# Patient Record
Sex: Female | Born: 1935 | Race: White | Hispanic: No | Marital: Married | Smoking: Former smoker
Health system: Southern US, Community
[De-identification: ages and names within clinical notes are randomized; demographics above are authoritative.]

## PROBLEM LIST (undated history)

## (undated) DIAGNOSIS — F329 Major depressive disorder, single episode, unspecified: Secondary | ICD-10-CM

## (undated) DIAGNOSIS — K219 Gastro-esophageal reflux disease without esophagitis: Secondary | ICD-10-CM

## (undated) DIAGNOSIS — D649 Anemia, unspecified: Secondary | ICD-10-CM

## (undated) DIAGNOSIS — I2510 Atherosclerotic heart disease of native coronary artery without angina pectoris: Secondary | ICD-10-CM

## (undated) DIAGNOSIS — C801 Malignant (primary) neoplasm, unspecified: Secondary | ICD-10-CM

## (undated) DIAGNOSIS — R51 Headache: Secondary | ICD-10-CM

## (undated) DIAGNOSIS — F32A Depression, unspecified: Secondary | ICD-10-CM

## (undated) DIAGNOSIS — I209 Angina pectoris, unspecified: Secondary | ICD-10-CM

## (undated) DIAGNOSIS — M1990 Unspecified osteoarthritis, unspecified site: Secondary | ICD-10-CM

## (undated) HISTORY — PX: CARDIAC CATHETERIZATION: SHX172

## (undated) HISTORY — PX: EYE SURGERY: SHX253

## (undated) HISTORY — PX: COLON SURGERY: SHX602

## (undated) HISTORY — PX: APPENDECTOMY: SHX54

## (undated) HISTORY — PX: BACK SURGERY: SHX140

## (undated) HISTORY — PX: ABDOMINAL HYSTERECTOMY: SHX81

## (undated) HISTORY — PX: FRACTURE SURGERY: SHX138

---

## 1999-07-02 ENCOUNTER — Encounter: Admission: RE | Admit: 1999-07-02 | Discharge: 1999-07-02 | Payer: Self-pay | Admitting: Neurosurgery

## 1999-07-02 ENCOUNTER — Encounter: Payer: Self-pay | Admitting: Neurosurgery

## 1999-07-07 ENCOUNTER — Ambulatory Visit (HOSPITAL_COMMUNITY): Admission: RE | Admit: 1999-07-07 | Discharge: 1999-07-07 | Payer: Self-pay | Admitting: Neurosurgery

## 1999-07-07 ENCOUNTER — Encounter: Payer: Self-pay | Admitting: Neurosurgery

## 2000-06-19 ENCOUNTER — Ambulatory Visit (HOSPITAL_COMMUNITY): Admission: RE | Admit: 2000-06-19 | Discharge: 2000-06-19 | Payer: Self-pay | Admitting: Cardiology

## 2001-11-03 ENCOUNTER — Encounter: Admission: RE | Admit: 2001-11-03 | Discharge: 2001-11-03 | Payer: Self-pay | Admitting: Neurosurgery

## 2001-11-03 ENCOUNTER — Encounter: Payer: Self-pay | Admitting: Neurosurgery

## 2001-11-16 ENCOUNTER — Encounter: Payer: Self-pay | Admitting: Neurosurgery

## 2001-11-16 ENCOUNTER — Encounter: Admission: RE | Admit: 2001-11-16 | Discharge: 2001-11-16 | Payer: Self-pay | Admitting: Neurosurgery

## 2002-02-28 ENCOUNTER — Encounter: Admission: RE | Admit: 2002-02-28 | Discharge: 2002-02-28 | Payer: Self-pay | Admitting: Neurosurgery

## 2002-02-28 ENCOUNTER — Encounter: Payer: Self-pay | Admitting: Neurosurgery

## 2003-02-10 ENCOUNTER — Inpatient Hospital Stay (HOSPITAL_COMMUNITY): Admission: RE | Admit: 2003-02-10 | Discharge: 2003-02-17 | Payer: Self-pay | Admitting: Neurosurgery

## 2004-03-28 ENCOUNTER — Ambulatory Visit (HOSPITAL_COMMUNITY): Admission: RE | Admit: 2004-03-28 | Discharge: 2004-03-28 | Payer: Self-pay | Admitting: Internal Medicine

## 2004-03-28 ENCOUNTER — Ambulatory Visit: Payer: Self-pay | Admitting: Internal Medicine

## 2004-09-30 ENCOUNTER — Ambulatory Visit: Payer: Self-pay | Admitting: Internal Medicine

## 2004-09-30 ENCOUNTER — Ambulatory Visit (HOSPITAL_COMMUNITY): Admission: RE | Admit: 2004-09-30 | Discharge: 2004-09-30 | Payer: Self-pay | Admitting: Internal Medicine

## 2004-10-25 ENCOUNTER — Encounter: Admission: RE | Admit: 2004-10-25 | Discharge: 2004-10-25 | Payer: Self-pay | Admitting: Vascular Surgery

## 2006-02-12 ENCOUNTER — Ambulatory Visit: Payer: Self-pay | Admitting: Cardiology

## 2006-02-16 ENCOUNTER — Inpatient Hospital Stay (HOSPITAL_BASED_OUTPATIENT_CLINIC_OR_DEPARTMENT_OTHER): Admission: RE | Admit: 2006-02-16 | Discharge: 2006-02-16 | Payer: Self-pay | Admitting: Cardiology

## 2006-02-16 ENCOUNTER — Ambulatory Visit: Payer: Self-pay | Admitting: Cardiology

## 2006-02-18 ENCOUNTER — Ambulatory Visit: Payer: Self-pay | Admitting: Cardiology

## 2006-02-19 ENCOUNTER — Ambulatory Visit (HOSPITAL_COMMUNITY): Admission: RE | Admit: 2006-02-19 | Discharge: 2006-02-20 | Payer: Self-pay | Admitting: Cardiovascular Disease

## 2006-02-19 ENCOUNTER — Ambulatory Visit: Payer: Self-pay | Admitting: Cardiovascular Disease

## 2006-02-27 ENCOUNTER — Ambulatory Visit: Payer: Self-pay | Admitting: Cardiology

## 2006-03-24 ENCOUNTER — Ambulatory Visit: Payer: Self-pay | Admitting: Cardiology

## 2006-03-26 ENCOUNTER — Ambulatory Visit: Payer: Self-pay | Admitting: Vascular Surgery

## 2006-04-17 ENCOUNTER — Ambulatory Visit: Payer: Self-pay | Admitting: Cardiology

## 2006-04-21 ENCOUNTER — Ambulatory Visit: Payer: Self-pay | Admitting: Cardiology

## 2006-04-28 ENCOUNTER — Ambulatory Visit: Payer: Self-pay | Admitting: Cardiology

## 2006-06-24 ENCOUNTER — Ambulatory Visit: Payer: Self-pay | Admitting: Internal Medicine

## 2006-06-29 ENCOUNTER — Ambulatory Visit (HOSPITAL_COMMUNITY): Admission: RE | Admit: 2006-06-29 | Discharge: 2006-06-29 | Payer: Self-pay | Admitting: Internal Medicine

## 2006-07-01 ENCOUNTER — Ambulatory Visit: Payer: Self-pay | Admitting: Internal Medicine

## 2009-12-04 ENCOUNTER — Ambulatory Visit: Payer: Self-pay | Admitting: Internal Medicine

## 2010-06-04 NOTE — Op Note (Signed)
NAME:  Alexandria Wilson, Alexandria Wilson               ACCOUNT NO.:  [id number]   MEDICAL RECORD NO.:  [id number]          PATIENT TYPE:  AMB   LOCATION:  DAY                           FACILITY:  [facility]   PHYSICIAN:  Lionel December, M.D.    DATE OF BIRTH:  1928/03/22   DATE OF PROCEDURE:  06/29/2006  DATE OF DISCHARGE:  06/29/2006                               OPERATIVE REPORT   PROCEDURE:  Esophageal manometry.   INDICATIONS:  Ms. Roepke is a 77 year old Caucasian female with  recurrent or daily chest pain who has undergone cardiac evaluation with  coronary stenting.  Her cardiologist feels that this is noncardiac pain.  She is been on PPI without any benefit.  She is undergoing manometry to  rule out diffuse esophageal spasm and nutcracker esophagus etc that  might explain her pain.  Procedure risks were reviewed the patient,  informed consent was obtained.   Procedure performed using Medtronic solid state system.   Esophageal body.  10 out of 10 swallows were peristaltic.  Mean  amplitude at 18 cm above the LES was 56.2 mmHg and duration is 2.9  seconds.   Mean amplitude at 13 cm proximal or above LES was 53.7 and duration was  3.5 seconds.   Mean amplitude at 8 cm above LES was 91.9 and duration was 5.3 seconds.   Mean amplitude at 3 cm above LES was 117.6 and duration was 5.3.   Mean amplitude in distal two ports was 104.8 mmHg.   Lower esophageal sphincter is located between 39 and 43 centimeters from  the nares.  Mean resting LES pressure was 12.5 mmHg.  Relaxation was  100%.   Upper esophageal sphincter was not studied.   IMPRESSION:  Low resting LES pressure, otherwise normal esophageal  manometry.   No evidence of diffuse esophageal spasm or nutcracker esophagus.   RECOMMENDATIONS:  If the patient does not respond to anxiolytic therapy,  would offer esophagogastroduodenoscopy with pH study with Bravo.      Lionel December, M.D.  Electronically Signed     NR/MEDQ  D:   07/01/2006  T:  07/01/2006  Job:  [id number]   cc:   Weyman Pedro  [address]  [place] [address]

## 2010-06-04 NOTE — Consult Note (Signed)
NAME:  Alexandria Wilson, Alexandria Wilson               ACCOUNT NO.:  [id number]   MEDICAL RECORD NO.:  [id number]          PATIENT TYPE:  AMB   LOCATION:  DAY                           FACILITY:  [facility]   PHYSICIAN:  Lionel December, M.D.    DATE OF BIRTH:  04/23/1928   DATE OF CONSULTATION:  06/24/2006  DATE OF DISCHARGE:                                 CONSULTATION   PRESENTING COMPLAINT:  Recurrent chest pain felt to be noncardiac.   HISTORY OF PRESENT ILLNESS:  Alexandria Wilson is a 77 year old Caucasian female who  is referred through courtesy of Dr. Eliberto Ivory for GI evaluation.  The  patient presents with a history of retrosternal and anterior chest pain  which started sometime in February.  She had a cardiac cath and she had  one artery stented.  However, she has never been pain free.  Either in  March or April of this year she had an exercise tolerance test as well  as nuclear medicine test both of which were negative.  She was seen by a  cardiologist and repeat cardiac cath was advised.  However, she has  decided not to have it.  Her cardiologist felt that her chest pain is  not cardiac in origin.  The patient is experiencing, chest pain every  other day if not daily.  On some days she may have more than one  episode.  The pain typically is relieved with nitroglycerin occasionally  she has taken up to three doses a day.  The pain has radiated into her  neck and jaw.  At times she has felt short of breath, but no history of  lightheadedness or palpitations.  She has been on Prilosec although she  has not experienced heartburn lately.  She cannot tell any difference  since she has been on Prilosec.  She denies dysphagia, hoarseness,  chronic cough but needs to clear her throat.  She has a good appetite  and has not lost any weight.  She has not been able to pinpoint any  triggers as far as this pain is concerned.  She has not experienced this  pain with exertional activity.   REVIEW OF SYSTEMS:  Negative for  melena, rectal bleeding or abdominal  pain.   MEDICATIONS:  1. She is on Imdur 30 mg three times a day.  2. Ogen 1.2 mg daily, Prilosec 20 mg q.a.m.  3. MVI every day daily.  4. ASA 325 mg daily.  5. Nitroglycerin sublingual p.r.n.  6. Restasis eye drops to both eyes.   PAST HISTORY:  She has dry eyes.  She had a sigmoid colon resection in  1986 for adenocarcinoma of the colon.  She had colonoscopy in January  1999 with removal of a small polyp which had edematous changes.  She had  another colonoscopy in March 2006 with removal of multiple polyps; three  of which were adenomas.  She returned for follow-up exam in September  2006 and no polyps were noted.  She had melanosis coli and external  hemorrhoids.   She has mitral valve prolapse.  She had lumbar spine surgery  for disk  disease 15 years ago and she had fusion of this area in January 2006.  She also had a laparotomy 18 months after childbirth  apparently to  correct some abnormalities related to extensive perineal tear. Other  surgeries include appendectomy.  She had hysterectomy at age 17 with  removal of ovaries.  One ovary had been removed at an earlier date.  She  has had bilateral cataract surgery.  She has a history of  hyperlipidemia.   ALLERGIES:  NK.   FAMILY HISTORY:  Mother had Alzheimer's disease and CHF and died at age  20.  Father died of stroke at age 34.  She has two brothers and one has  tachy arrhythmia and CHF.   SOCIAL HISTORY:  She is married.  She has one daughter who has multiple  medical problems. She is retired from [employer] where she worked for 33  years. She has been smoking for years.  She smoked no more 2-3 per day  and now down to one cigarettes per day and is trying her best to quit.  She does not drink alcohol.   OBJECTIVE:  GENERAL:  Pleasant well-developed thin Caucasian female who  is in no acute distress.  VITAL SIGNS:  She weighs 115.5 pounds.  She is 5 feet tall.  Pulse 60  per  minute, blood pressure 102/60, temperature is 98.4.  HEENT:  Conjunctivae is pink.  Sclerae is nonicteric. Oral pharyngeal  mucosa is normal.  She has upper dentures.  She has few of her teeth in  the lower jaw.  She states she lost a partial.  NECK:  No neck masses or thyromegaly noted.  HEART:  Cardiac exam with regular rhythm.  Normal S1-S2.  No click or  murmur noted.  LUNGS:  Clear to auscultation.  No chest wall tenderness noted on  palpation.  Her abdomen is flat and soft without tenderness,  organomegaly or masses.  RECTAL:  Examination is deferred.  EXTREMITIES:  She does not have peripheral edema or clubbing.   ASSESSMENT:  1. Recurrent chest pain.  She is status post coronary stenting in      February this year and she had noninvasive follow up testing which      has been negative.  She does not have any symptoms whatsoever to      suggest gastroesophageal reflux disease or for that matter      esophageal spasm.  The patient is very much interested in      esophageal manometry test that had been recommended to her by Dr.      Eliberto Ivory.  I feel it would be reasonable, although she has not had      her upper GI tract evaluated radiographically or endoscopically.      It is certainly possible that her chest pain is due to GE reflux      and we can optimize the therapy and see what happens.   RECOMMENDATIONS:  1. The patient is advised to discontinue Prilosec.  2. We will start her on Nexium 40 mg p.o. q.a.m.  3. We will schedule for esophageal manometry at [facility] in the near      future.  If manometry is negative may consider EGD with pH study.      I have also recommended lorazepam at 2.5 mg t.i.d. just in case she      is having chest pain due to esophageal spasm.  Prescription given  for 90 with one refill.  Please note that she has taken      lorazepam in a much higher dose intermittently in the past without     any significant problems.  I have reviewed the procedure  with the      patient and she is agreeable.   We appreciate the opportunity to participate in the care of this nice  lady.      Lionel December, M.D.  Electronically Signed     NR/MEDQ  D:  06/24/2006  T:  06/25/2006  Job:  [id number]   cc:   Weyman Pedro  [address]  [place] [address]   [facility]

## 2010-06-07 NOTE — Assessment & Plan Note (Signed)
[facility]                          [facility] NOTE   MAHITHA, HICKLING                      MRN:          [id number]  DATE:03/24/2006                            DOB:          April 03, 1928    PRIMARY CARDIOLOGIST:  Learta Codding, MD,FACC   REASON FOR VISIT:  Post-hospital followup.   Miss Ricciardi presents in clinic followup after undergoing recent  percutaneous intervention with bare metal stenting of a high grade  proximal LAD lesion, found during initial outpatient diagnostic  catheterization for further evaluation of recurrent chest pain. Residual  anatomy revealed nonobstructive coronary artery disease and left  ventricular function was normal with no wall motion abnormalities.   Patient then was briefly hospitalized here at [facility] on February 7th,  1 week after undergoing percutaneous intervention, with complaint of  chest pain. She ruled out for myocardial infarction and serial EKGs  showed no acute changes. We saw the patient in consultation and added  Imdur 30 q.d. to her medication regimen. We also noted a bruit in the  right groin and ordered a Duplex scan which was negative for  pseudoaneurysm. We also detected a bruit of the left  carotid/supraclavicular region and suggested further evaluation as an  outpatient. The carotid Doppler was done and did suggest a 60%-79% left  ICA stenosis, which was new compared to previous study of June 2003.   Clinically, patient has not had any further angina pectoris, which  precipitated her recent brief stay here at [facility], since being placed  on Imdur. However, she complains of nausea on this medication with no  associated vomiting and has diminished energy as well.   Patient has completed her 1 month course of Plavix and has been  compliant with full dose aspirin as well.   Electrocardiogram today reveals normal sinus rhythm at 65 BPM with  normal axis and no ischemic  changes.   CURRENT MEDICATIONS:  1. Aspirin 325 q.d.  2. Isosorbide 30 q.d.  3. Ogen 0.625 b.i.d.  4. Restasis eye drops as directed.   PHYSICAL EXAMINATION:  Blood pressure 106/57, pulse 67 regular, weight  115.6.  GENERAL: A 77 year old female sitting upright, no distress.  HEENT: Normocephalic, atraumatic.  NECK: Palpable bilateral carotid pulses with left supraclavicular  bruits.  LUNGS: Clear to auscultation all fields.  HEART: Regular rate and rhythm (S1, S2) no significant murmurs.  ABDOMEN: Soft, nontender, intact bowel sounds.  EXTREMITIES: Right groin stable with no ecchymosis, hematoma, or bruits  on auscultation; intact right femoral and posterior tibialis pulse  without edema.   IMPRESSION:  1. Severe single vessel coronary artery disease.      a.     Status post recent bare metal stenting, high grade proximal       left anterior descending stenosis.      b.     Residual nonobstructive coronary artery disease.      c.     Normal left ventricular function.  2. Cerebral vascular disease.      a.     60%-79% left ICA stenosis.  3. Chronic hypotension.  4. Hyperlipidemia.      a.     INTOLERANT TO MULTIPLE STATINS.  5. History of tobacco.   PLAN:  1. Down titrate Imdur to 15 mg q.d., to see if patient can better      tolerate this.  2. Refer patient to Dr. Gretta Began in [place] for further      evaluation of cerebral vascular disease, with whom she followed in      the past.  3. Schedule return clinic follow up with myself and Dr. Andee Lineman in 6      months.      Gene Serpe, PA-C  Electronically Signed      Learta Codding, MD,FACC  Electronically Signed   GS/MedQ  DD: 03/24/2006  DT: 03/24/2006  Job #: [id number]   cc:   Weyman Pedro

## 2010-06-07 NOTE — Assessment & Plan Note (Signed)
[facility]                          [facility]   Alexandria Wilson, Alexandria Wilson                      MRN:          [id number]  DATE:04/17/2006                            DOB:          July 08, 1928    REASON FOR OFFICE VISIT:  The patient came by the office urgently due to  concerns of new onset chest pain.   HISTORY OF PRESENT ILLNESS:  The patient is an elderly female with a  history of single vessel coronary artery disease.  She is status post  bare metal stenting of a high grade proximal LAD lesion.  She also has  residual nonobstructive coronary artery disease.  She has normal LV  function.  The patient was briefly hospitalized at [facility] on February 26, 2006, one week after undergoing percutaneous coronary intervention  with a complaint of chest pain.  She ruled out for myocardial  infarction.  Serial EKGs showed no acute changes.  The patient was seen  in consultation and Imdur 30 mg a day was added to her medical regimen.  The patient also was found to have a bruit in the right carotid.  A  duplex scan was negative for pseudoaneurysm.  In addition, a bruit of  the left carotid supraclavicular region was detected and further  evaluation was performed as an outpatient.  Doppler study was done that  suggested 79% left ICA stenosis which was new compared to previous study  of June 2003. The patient was seen on March 24, 2006, in the office by  Gene Serpe, PA-C. The patient actually was doing quite well.  She had no  further angina pectoris.  She had no orthopnea or PND.  She had no  palpitations or syncope.  The patient at that time completed her one-  month course of Plavix for bare metal stenting.   The patient has brought herself emergently to our office and wanted to  be seen as an add-on.  She stayed up last night while at [facility],  she  experienced a sudden onset of substernal chest pain which she felt was  reminiscent to the pain she  experienced when Dr. Excell Seltzer blew up the  balloon in her heart.  She stated it was a heavy left sided pressure  radiating to the neck area.  She took two nitroglycerin with the second  one eventually relieving her symptoms.  The patient over the course of  the evening had no further symptoms.  However later today, the patient  again started to experience similar symptoms that occurred at rest.  There was no shortness of breath or diaphoresis.  Her symptoms are  actually quite reminiscent of her presentation of February 26, 2006 to  [facility].  A STAT EKG was done in the office which showed no  acute ischemic change.  The patient also was given nitroglycerin in the  office with improvement in her symptoms.   MEDICATIONS:  1. Multivitamin daily.  2. Citracal.  3. Ogen 0.625 mg p.o. b.i.d.  4. BC Powder p.r.n.  5. Aspirin 325 daily.  6. Imdur 50 mg p.o.  daily.   ALLERGIES:  1. DOXYCYCLINE.  2. INTOLERANCE TO MULTIPLE STATINS.   SOCIAL HISTORY:  The patient lives with her husband in [place].   FAMILY HISTORY:  Noncontributory.   REVIEW OF SYSTEMS:  No nausea or vomiting, no fever or chills, no  orthopnea, PND, no palpitations or syncope, no melena or hematochezia,  no dysuria or frequency, no neurological symptoms, no visual changes, no  auditory changes.   PHYSICAL EXAMINATION:  VITAL SIGNS:  Blood pressure is 121/67, heart  rate is 68 beats per minute.  GENERAL:  A well nourished white female in no apparent distress.  HEENT:  Pupils are equal, round, reactive.  Conjunctivae clear.  NECK:  Supple.  Normal carotid upstroke.  A soft bilateral carotid  bruits with a left supraclavicular bruit.  LUNGS:  Clear breath sounds bilaterally.  HEART:  Regular rate and rhythm.  Normal S1 S2.  No murmurs, rubs, or  gallops.  ABDOMEN:  Soft, nontender.  No rebound, tenderness, or guarding.  Good  bowel sounds.  EXTREMITIES:  No cyanosis, clubbing, or edema.  NEUROLOGIC:  The patient is  alert, oriented, grossly nonfocal.   PROBLEM LIST:  1. Substernal chest pain, rule out unstable angina.  2. Severe single vessel coronary artery disease.      a.     Status post recent bare metal stenting of a high grade       proximal left anterior descending artery lesion.      b.     Residual nonobstructive disease.      c.     Normal left ventricular function.  3. Cerebrovascular disease.      a.     A 79% left internal carotid artery stenosis.  4. Chronic hypotension, stable.  5. Dyslipidemia.      a.     Intolerant of multiple Statins.  6. History of tobacco use.   PLAN:  1. The patient will be admitted to [facility] for a rule out      myocardial infarction protocol.  2. The patient's chest pain, although reminiscent of her prior pain is      still somewhat atypical and there are no acute EKG changes.  I      suspect the patient is low risk for restenosis.  3. The patient will be given Lopressor and p.r.n. nitroglycerin as      well as Imdur increased to 30 mg a day.  4. The patient will be referred to the emergency room for admission.  5. If the cardiac enzymes are negative, I do anticipate the patient      can be discharged and      scheduled for a stress test next week.  We will follow her up in      the office also in one week after hospital discharge.     Learta Codding, MD,FACC  Electronically Signed    GED/MedQ  DD: 04/17/2006  DT: 04/17/2006  Job #: [id number]

## 2010-06-07 NOTE — Assessment & Plan Note (Signed)
[facility]                          [facility]   TYAH, ACORD                      MRN:          [id number]  DATE:02/18/2006                            DOB:          1928-01-30    HISTORY OF PRESENT ILLNESS:  The patient is a 77 year old female with  history of substernal chest pain.  The patient was recently referred for  cardiac catheterization and was found to have 70% to 80% proximal LAD  lesion.  The patient presented today in the office to discuss further  planned intervention for her coronary artery disease.   She tells me that she still has ongoing chest pain, particularly on  exertion.  As noted in my previous note, the patient did take  nitroglycerin with improvement in her symptoms.  Her symptoms are  consistent with angina.  Review of the catheterization with Dr. Excell Seltzer  and Dr. Samule Ohm gave somewhat divergent opinions.  Dr. Excell Seltzer felt that  the patient's lesion definitely was significant and could benefit from  stenting.  Dr. Samule Ohm felt that the patient should receive a stress test  first.  Therefore, I had the patient come back today to further discuss  this.  However, of note is that this patient had a stress test done two  years ago which was already abnormal with apical and inferior lesions,  prompting a cardiac catheterization at that time.  Therefore, I do not  think we can use the stress test as a good guide to whether this patient  has ischemia or not, and I have already discussed the plan with Dr.  Excell Seltzer.  It is our feeling that we should proceed at this point in time,  possibly with IVUS and most likely stenting of the LAD lesion.  Discussed at length with the patient and her husband and they are very  much in favor to proceed in this pattern.   The patient has multiple intolerances to medications including MULTIPLE  STATINS.  Her blood pressure is also low normal and she states she does  not do  well with medications.  Of note is that this patient actually did  take PLAVIX  back in 2006 which was started by Dr. Eliberto Ivory.  She took it  for six months.  She eventually stopped it due to stomach upset.  Will  make sure that this information get relayed to Dr. Excell Seltzer.   Nevertheless, it seems that this patient could certainly benefit from  stenting as I feel convinced that her symptoms are consistent with  angina, particularly in light that she probably will not be able to  tolerate much in the way of blood pressure medications.   I placed a call to Dr. Eliberto Ivory to update him on the situation.  I also  will put in a call to Dr. Excell Seltzer to let him know that the patient will  be scheduled for an IVUS and possible stent placement in the morning.     Learta Codding, MD,FACC  Electronically Signed    GED/MedQ  DD: 02/18/2006  DT: 02/18/2006  Job #: [id number]  cc:   Weyman Pedro

## 2010-06-07 NOTE — Op Note (Signed)
NAME:  Alexandria Wilson, Alexandria Wilson               ACCOUNT NO.:  [id number]   MEDICAL RECORD NO.:  [id number]          PATIENT TYPE:  AMB   LOCATION:  DAY                           FACILITY:  [facility]   PHYSICIAN:  Lionel December, M.D.    DATE OF BIRTH:  26-Dec-1928   DATE OF PROCEDURE:  03/28/2004  DATE OF DISCHARGE:                                 OPERATIVE REPORT   PROCEDURE:  Total colonoscopy with polypectomy.   INDICATIONS:  [name] is a 77 year old Caucasian female who is status  post sigmoid colon resection for carcinoma over 20 years ago, whose last  surveillance exam was in May 2002, when she had three small polyps ablated  via cold biopsy which were adenomas.  She is now returning for surveillance  exam.  She is almost yearly.  She has had some other medical issues  resulting in postponing of her colonoscopy.  She remains free of symptoms.  She has chronic constipation for which she takes medicines.   The procedure risks were reviewed the patient, informed consent was  obtained.   PREMEDICATION:  Versed 3 mg IV.   FINDINGS:  Procedure performed in endoscopy suite.  The patient's vital  signs and O2 saturation were monitored during procedure and remained stable.  The patient was placed in the left lateral recumbent position and rectal  examination performed.  No abnormality noted external or digital exam.  Olympus video scope was placed in the rectum and advanced under vision into  sigmoid colon and beyond.  She had diffuse  changes of melanosis coli.  She had multiple polyps.  All of the larger ones  were removed but not the smaller ones.  The scope was passed to the cecum,  which was identified by appendiceal orifice and ileocecal valve.  Pictures  taken for the record.  There were five polyps at cecum.  Two of these were  raised with submucosal injection of saline and snared, one was partially  coagulated.  The three polyps smaller simply coagulated using snare tip.  There was a 15 to  16-mm sessile polyp at transverse colon, which was snared  after raising it with a submucosal injection of saline.  There was another  polyp close to it, which was small and coagulated using snare tip.  Two  polyps were snared from descending colon, one after raising it with a  submucosal injection of normal saline.  These were 6 to 8 mm.  One was large  and 8-10 mm.  There were two more polyps that were snared from the sigmoid  colon.  One of these polyps was about 6 x 12 mm and was snared after  elevating with submucosal injection of normal saline.  Rectal mucosa was normal.  Scope was retroflexed to examine anorectal  junction, which was unremarkable.  Endoscope was straightened and withdrawn.  The patient tolerated the procedure well.   FINAL DIAGNOSES:  1.  Melanosis coli.  2.  Multiple colonic polyps.  Seven were snared, two from the cecum, one      from transverse colon, two from descending and two from  sigmoid.  At      least four small polyps were coagulated.  She still had few polyps      remaining.   RECOMMENDATIONS:  1.  Standard instructions given.  2.  We will plan to bring her back in six months for repeat colonoscopy and      hopefully take care of the rest of the polyps.      NR/MEDQ  D:  03/28/2004  T:  03/28/2004  Job:  [id number]   cc:   Dr. Eliberto Ivory

## 2010-06-07 NOTE — Op Note (Signed)
NAME:  LATORIE, MONTESANO                         ACCOUNT NO.:  [id number]   MEDICAL RECORD NO.:  [id number]                   PATIENT TYPE:  INP   LOCATION:  3172                                 FACILITY:  [facility]   PHYSICIAN:  Coletta Memos, M.D.                  DATE OF BIRTH:  June 22, 1928   DATE OF PROCEDURE:  02/10/2003  DATE OF DISCHARGE:                                 OPERATIVE REPORT   PREOPERATIVE DIAGNOSIS:  1. Degenerative disc disease L4-L5 and L5-S1.  2. Recurrent disc herniation L4-L5.  3. 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  4. Lumbar radiculopathy.   POSTOPERATIVE DIAGNOSIS:  1. Degenerative disc disease L4-L5 and L5-S1.  2. Recurrent disc herniation L4-L5.  3. 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  4. Lumbar radiculopathy.   PROCEDURE:  L4 laminectomy, posterolateral arthrodesis L4 to L5, pedicle  screw fixation L4-L5, redo discectomy L4-L5.   COMPLICATIONS:  Dural tear repaired primarily.   SURGEON:  Coletta Memos, M.D.   ASSISTANT:  Hewitt Shorts, M.D.   INDICATIONS FOR PROCEDURE:  Alexandria Wilson is a 77 year old woman who  presented with severe degenerated disc at L4-L5 and L5-S1 and pain in the  right lower extremity due to what appeared to be a recurrent disc at L4-L5  on the right side.  I recommended and she agreed to undergo a two level  fusion L4 to S1 and redo discectomy.   OPERATIVE NOTE:  The patient was brought to the operating room, intubated,  and placed under general anesthesia without difficulty.  She was rolled  prone on body rolls and all pressure points were properly padded.  Her back  was prepped and she was draped in a sterile fashion.  A Foley catheter had  been placed sterilely prior to positioning.  I opened the skin with a #10  blade using the old incision as a guide exposing the lamina of L4, L5, and  S1.  I then, while using the Kerrison punch to undermine the lamina of L4,  noticed I had a large  arachnoid bubble.  No CSF had come out at that point  in time.  I needed to remove further scar tissue and in removing the scar  tissue, the dura did open.  I was able to close that hole primarily.  Then,  using the Kerrison punch again, now caudally, again with scar tissue and  after dissecting some, I noticed a hole open in the dura and that, too, was  repaired primarily.  I eventually was able to retract the thecal sac  medially.  I identified the L4 and L5 pedicles.  I identified those nerve  roots.  I was able to decompress the nerve roots.  I did remove what  appeared to be partially calcified material where the disc space was.  I was  not able to get into the disc space as it  was markedly degenerated and  essentially collapsed.  At L5-S1, that level was so collapsed I did not  think that it would be worth going again through the scar, but I did place  pedicle screws there.  The thecal sac was not compressed at that level.  Pedicle screws were placed with fluoroscopic guidance at L4, L5, and S1  bilaterally.  I had to remove the screw at L5 on the right side as the screw  heads were too close together.  I simply placed the rod spanning L4 to S1  and let L5 float on the right side.  There were three screws placed on the  left side.  This was the Legacy System, 5.25 by 35 mm screws were placed at  each level.  These screws were first placed by using a pedicle probe,  tapping, inspecting that hole for any bone breeches, and then placing the  screw.  AP and lateral films showed the screws to be in good position.  I  then placed the rod and secured those.  Then, I used Tisseel and Duragen to  cover the dural defects.  The was then closed in layered fashion using  Vicryl sutures.  A sterile dressing was applied.  The patient tolerated the  procedure well.                                               Coletta Memos, M.D.    KC/MEDQ  D:  02/10/2003  T:  02/11/2003  Job:  [id number]

## 2010-06-07 NOTE — Assessment & Plan Note (Signed)
[facility]                          [facility] OFFICE NOTE   ABBAGALE, GOGUEN                      MRN:          [id number]  DATE:04/28/2006                            DOB:          January 19, 1929    PRIMARY CARDIOLOGIST:  Learta Codding, MD,FACC.   REASON FOR VISIT:  Ms. Alexandria Wilson is a 77 year old female, well known to [place],  with recently documented severe single-vessel coronary artery disease,  treated with bare metal stenting with a high grade proximal LAD lesion  in January of this year, who is now referred back to our clinic for re-  evaluation of recurrent chest pain relieved with nitroglycerin.   Since undergoing percutaneous intervention, the patient has been  hospitalized here at [facility] on several occasions with a complaint of  chest pain, most recently on March 28th, at which time she was seen here  in our clinic by Dr. Andee Lineman for complaint of chest pain.  She was  admitted for a 23-hour observation, and after she ruled out for  myocardial infarction with negative serial markers, she was referred for  a follow-up exercise stress test, which was completed on April 1st.  This was read by Dr. Myrtis Ser and revealed normal nuclear images with no  sign of scar or ischemia, normal wall motion, and an ejection fraction  of 70%.  Additionally, patient was able to exercise for 6-1/2 minutes,  each at stage III, achieved 95% of PMHR, and had no associated chest  discomfort.   Nevertheless, the patient reports that she has continued to have chest  pain, essentially on a daily basis, requiring the use of several  nitroglycerin tablets.  It is not clear, however, if her symptoms  respond promptly to the nitroglycerin, but she seems to feel they are  mitigated by it.   Most recently, the patient was referred for a hepatobiliary scan, per  Dr. Eliberto Ivory, which was interpreted as abnormal with only 13% effective  contraction of the gallbladder; however,  the patient did not describe  sharp right upper quadrant pain, which would have been suggestive of  biliary dyskinesia.   A recent gallbladder ultrasound suggested nonobstructive gallbladder  sludge with otherwise normal-appearing common bile duct.   An electrocardiogram in our office today reveals NSR at 66 BPM with  normal axis and no ischemic changes.   ALLERGIES:  No known drug allergies.   CURRENT MEDICATIONS:  1. Full dose aspirin.  2. Imdur 30 b.i.d.  3. Ogen 0.625 b.i.d.  4. Citracal b.i.d.  5. BC powders p.r.n.  6. Restasis eye drops.   PHYSICAL EXAMINATION:  VITAL SIGNS:  Blood pressure 103/60, pulse 75 and  regular, weight 113.  GENERAL:  A 77 year old female sitting upright in no distress.  HEENT:  Normocephalic and atraumatic.  NECK:  Palpable bilateral carotid pulses with left supraclavicular  bruits.  LUNGS:  Clear to auscultation in all fields.  HEART:  Regular rate and rhythm (S1 and S2).  No significant murmurs.  ABDOMEN:  Soft and nontender.  Intact bowel sounds.  EXTREMITIES:  Palpable distal pulses without edema.  NEUROLOGIC:  No focal deficits.   IMPRESSION:  1. Recurrent chest pain.      a.     Nonexertional, improved with nitroglycerin.      b.     Normal adequate exercise stress Cardiolite, April 21, 2006.  2. Severe single-vessel coronary artery disease.      a.     Status post bare metal stenting high grade proximal left       anterior descending artery, January 2008.      b.     Residual nonobstructive coronary artery disease.      c.     Normal left ventricular function.  3. Cerebrovascular disease.      a.     A 60-70% left internal carotid artery stenosis.  4. Chronic hypotension.  5. Hyperlipidemia.      a.     Intolerant to multiple statins.  6. History of tobacco.   PLAN:  Proceed with elective cardiac catheterization in the [facility] lab on  Friday, April, 11th.  Patient is agreeable with this plan and is willing  to proceed.  If the  catheterization shows continued patency of her LAD  stent site as well as no interim development of coronary artery disease  progression, then we will continue medical management and reassurance  that the chest pain is noncardiac in etiology.  She is to then resume  followup with Dr. Eliberto Ivory for further evaluation for other possible  explanations for her recurrent chest pain.  Patient is in agreement with  this and appears to understand and is willing to proceed with the re-  look catheterization.  Meanwhile, we will continue current medication  regimen, which includes the recent up-titration of her Imdur to the  current dose.  Once again, patient has been advised to proceed directly  to  the emergency room in the event that she were to have chest pain  unrelieved after a third nitroglycerin tablet.      Rozell Searing, PA-C  Electronically Signed      Luis Abed, MD, [facility]  Electronically Signed   GS/MedQ  DD: 04/28/2006  DT: 04/29/2006  Job #: [id number]   cc:   Weyman Pedro

## 2010-06-07 NOTE — Cardiovascular Report (Signed)
NAME:  Alexandria Wilson, Alexandria Wilson               ACCOUNT NO.:  [id number]   MEDICAL RECORD NO.:  [id number]          PATIENT TYPE:  OIB   LOCATION:  6524                         FACILITY:  [facility]   PHYSICIAN:  Veverly Fells. Excell Seltzer, MD  DATE OF BIRTH:  17-Jul-1928   DATE OF PROCEDURE:  02/19/2006  DATE OF DISCHARGE:                            CARDIAC CATHETERIZATION   PROCEDURE:  Attempted IVUS which was aborted secondary to severe  coronary vasospasm, PTCA and stenting of the proximal LAD, and StarClose  of the right femoral artery.   INDICATIONS:  Ms. Milligan is a delightful 77 year old woman who underwent  a diagnostic catheterization earlier this week that demonstrated high-  grade proximal LAD disease.  She was ultimately referred for  intravascular ultrasound and probable percutaneous coronary intervention  of this area.  She has had chest pain particularly with exertion; and  she has had an abnormal Myoview study in the past.   PROCEDURAL DETAILS:  Risks and indications of the procedure were  explained to the patient.  Informed consent was obtained.  The right  groin was prepped, draped, and anesthetized with 1% lidocaine.  A 6-  French sheath was inserted into the right femoral artery using the  modified Seldinger technique.  A 6-French XB 3.5 mm guiding catheter was  inserted.  Angiomax was used for anticoagulation.  The patient had been  preloaded on Plavix.  Once therapeutic ACT was achieved, a BMW guidewire  was passed beyond the area of concern into the distal LAD.  The IVUS  catheter was inserted into the LAD, but upon doing this, the entire  vessel developed severe vasospasm.  The patient became hypotensive.  I  elected to remove the wire IVUS catheter and all equipment from the  coronary.  She recovered quickly, but had residual significant vasospasm  by angiography in the mid-and-distal LAD.  I gave intracoronary  nitroglycerin as well as adenosine and the vasospasm ultimately  resolved.   At that point I elected to the treat the lesion in her proximal LAD with  PTCA and stenting.  The patient essentially has a high-grade stenosis in  the proximal LAD just before a large first diagonal branch; and then has  a moderate lesion just beyond that branch.  The actual diagonal branch  does not appear to have significant ostial disease.  I inserted a 2.0 x  12-mm Maverick balloon into the proximal LAD; and inflated that balloon  to 10 atmospheres.  The lesion appeared compliant; and the patient  tolerated that well.  After reviewing multiple views of the LAD.  I  elected to set across the diagonal branch with a 2.5 x 16 mm Express  stent.  This was chosen because the patient has had a great deal of  difficulty with tolerating multiple medications.  She has been on  clopidogrel in the past for 6 months and had a lot of GI upset; and it  stopped that medication.  I was not confident that she would be able to  be maintained on clopidogrel for the long-term; therefore, an Express  stent was inserted as  described.  It was deployed at 12 atmospheres  pressure.  I elected to post dilate it with a 2.75 x 12-mm Quantum  Maverick balloon up to 16 atmospheres in the distal portion of the  stent; and then to 20 atmospheres in the proximal portion of the stent.  There appeared to be excellent stent expansion; and there was TIMI III  flow throughout the vessel.  There was excellent diagonal flow.  There  was minor compromise of the diagonal branch, but there did not appear to  be a significant ostial lesion; and there was TIMI III flow throughout.   CONCLUSION:  Successful PTCA and stenting of the proximal LAD.   RECOMMENDATIONS:  Recommend lifelong aspirin with 30 days of  clopidogrel.  The patient should be maintained on her medical therapy  for coronary artery disease.      Veverly Fells. Excell Seltzer, MD  Electronically Signed     MDC/MEDQ  D:  02/19/2006  T:  02/19/2006  Job:   [id number]

## 2010-06-07 NOTE — Cardiovascular Report (Signed)
[place]  Patient:    Alexandria Wilson, Alexandria Wilson                      MRN: [id number] Proc. Date: 06/19/00 Adm. Date:  [id number] Attending:  Rollene Rotunda CC:         Weyman Pedro, M.D.  Rollene Rotunda, M.D. [facility]  Cath Lab   Cardiac Catheterization  INDICATIONS:  Ms. Laba is a delightful 77 year old lady who has had some recurrent chest discomfort.  Some of this is involved in neck and shoulder, according to her husband.  She had ST depression on her exercise test, but no significant perfusion defect by radionuclide imaging.  She was subsequently referred for cardiac catheterization.  PROCEDURE: 1. Left heart catheterization. 2. Selective coronary arteriography. 3. Selective left ventriculography.  DESCRIPTION OF PROCEDURE:  The patient was brought to the catheterization lab and prepped and draped in the usual fashion.  Through an anterior puncture, the right femoral artery was easily entered.  The patient was sedated with a combination of Valium and Benadryl, and with this developed a little bit of movement changes.  However, we were able to complete the procedure without difficulty.  Views of the left coronary artery were taken as well as ventriculography.  Central aortography was performed.  There was extensive calcification involving the ostium of the right coronary.  We were eventually able to use a No-Torque right coronary catheter which sat in the ostium without damping.  We were able to see the right coronary throughout its course.  The patient tolerated the procedure well and was taken to the holding area in satisfactory clinical condition.  HEMODYNAMIC DATA:  Left ventricular pressure 152/9/17.  Aortic pressure 164/80.  No gradient on pullback.  ANGIOGRAPHIC DATA:  Left ventriculography was performed in the RAO projection. Overall systolic function appeared to be well preserved.  No segmental abnormalities or contraction were  identified.  Ejection fraction was calculated at 52%, but visually appeared to be more vigorous than this.  Aortic root aortography revealed mobile aortic leaflets.  The aortic root did not appear to be dilated and there was no significant aortic regurgitation. There was extensive calcification near the right coronary ostium which had been noted previously 11 years earlier.  The left main coronary artery was free of critical disease.  The left anterior descending artery coursed to the apex.  There was a septal and a large diagonal.  Both of these vessels appeared to be free of critical disease with minimal luminal irregularity.  The circumflex provided two marginal branches, the first of which bifurcated distally.  The circumflex and its branches appeared to be smooth and without significant focal obstruction.  The right coronary artery demonstrates extensive calcification at the ostium. There is tapered narrowing at the opening that would appear to taper down to about 50% of its original luminal diameter, but it is somewhat difficult to grade because of the overlying calcium.  However, there is moderately free reflux and no damping of the diagnostic catheter in the right ostium.  The right coronary artery distally is smooth leading into a posterior descending and posterolateral branch both of which are smooth.  CONCLUSION: 1. Preserved left ventricular function. 2. No significant focal obstruction of the left coronary artery. 3. Ostial calcification and moderate stenosis of the right coronary ostium    with previous catheterization 10 years earlier demonstrating calcification    by report.  DISPOSITION:  The patients symptoms are somewhat atypical  according to her husband, she does develop some neck and shoulder pain associated with some of this, and perhaps C-spine series would be worthwhile.  At the present time, Dr. Antoine Poche will decide on further cardiac evaluation, but most  likely this will consist of reassurance.  The right ostium, because of the heavy calcification, is not favorable for percutaneous intervention, but could be done as a last resort if she were to demonstrate ischemia. DD:  06/19/00 TD:  06/19/00 Job: [id number]

## 2010-06-07 NOTE — Discharge Summary (Signed)
NAME:  Alexandria Wilson, Alexandria Wilson               ACCOUNT NO.:  [id number]   MEDICAL RECORD NO.:  [id number]          PATIENT TYPE:  OIB   LOCATION:  6524                         FACILITY:  [facility]   PHYSICIAN:  Veverly Fells. Excell Seltzer, MD  DATE OF BIRTH:  [birth year]   DATE OF ADMISSION:  02/19/2006  DATE OF DISCHARGE:  02/20/2006                               DISCHARGE SUMMARY   PHYSICIANS:  Primary cardiologist Dr. Andee Lineman, primary care physician Dr.  Eliberto Ivory, both of which are in [place].   PRINCIPAL DIAGNOSIS:  Coronary artery disease.   SECONDARY DIAGNOSES:  1. Hyperlipidemia.  2. History of colon cancer status post hemicolectomy.  3. History of remote lumbar disk surgery.  4. Questionable history of mitral valve prolapse.   ALLERGIES:  The patient is intolerant to MULTIPLE STATINS.   PROCEDURES:  Successful PCI and stenting of the LAD with placement of a  2.5 x 60 mm Express II bare metal stent.   HISTORY OF PRESENT ILLNESS:  A 77 year old female with prior history of  coronary artery disease who recently saw Dr. Andee Lineman in the office  February 12, 2006 with complaints of exertional chest discomfort. She  subsequently underwent left heart cardiac catheterization performed as  an outpatient on February 16, 2006 revealing significant LAD stenosis and  normal LV function with an EF 65%. Arrangements were then made to come  back for PCI.   HOSPITAL COURSE:  The patient was taken to the cath lab on February 19, 2006 and underwent successful PCI and stenting of the proximal LAD with  placement of a 2.5 x 16 mm Express II bare metal stent. She tolerated  this procedure well, and postprocedure cardiac enzymes are negative. She  has been ambulating this morning without any recurrent discomfort or  limitations, and she is being discharged home today in satisfactory  condition.   DISCHARGE LABORATORIES:  Hemoglobin 11.8, hematocrit 34.4, WBC 5,  platelets 119. Sodium 140, potassium 3.7, chloride 107, CO2 26,  BUN 11,  creatinine 0.65, glucose 79. Calcium 8.8. CK 30, MB 1.4, troponin I  0.02.   DISPOSITION:  Patient is being discharged home today in good condition.   FOLLOW-UP PLANS AND APPOINTMENTS:  She has follow-up appointment with  Dr. Andee Lineman on February 15 at 2:45 p.m. She is asked to follow up with  Dr. Eliberto Ivory as previously scheduled.   DISCHARGE MEDICATIONS:  1, Aspirin 325 mg daily.  1. Plavix 75 mg daily x30 days.  2. Multivitamin one daily.  3. Nitroglycerin 0.4 mg sublingual p.r.n. chest pain.  4. Restasis eye drops one drop both eyes b.i.d.  5. Ogen as previously prescribed.  6. Citrucel Plus D three tablets daily.   OUTSTANDING LABORATORY STUDIES:  None.   Duration discharge encounter 35 minutes including physician time.      Nicolasa Ducking, ANP      Veverly Fells. Excell Seltzer, MD  Electronically Signed    CB/MEDQ  D:  02/20/2006  T:  02/20/2006  Job:  [id number]   cc:   Eliberto Ivory, M.D.

## 2010-06-07 NOTE — Assessment & Plan Note (Signed)
[facility]                          [facility] NOTE   Alexandria Wilson, Alexandria Wilson                      MRN:          [id number]  DATE:02/12/2006                            DOB:          11-21-1928    HISTORY OF PRESENT ILLNESS:  The patient is a 77 year old female with a  history of nonobstructive coronary artery disease.  The patient's last  catheterization was in May of 2002.  Her most significant lesion during  catheterization was 50% osteal right coronary artery stenosis.  She was  treated medically based on these findings.  The patient has been doing  well form a cardiovascular perspective over the last several years.  However, a visit was now prompted to our office after she experienced a  particularly bad episode of substernal chest pain last Friday.  She  reports that she had a heavy substernal chest pressure, which radiated  from her epigastric area into the chest, as well as to the neck area.  The patient feels slightly nauseated, but denied any shortness of  breath, and she had no clamminess.  The patient also then radiated down  into her left arm and became quite severe.  Her husband, who has heart  disease, had some nitroglycerin available, and she took 1.  This fairly  promptly removed her symptoms.  She was urged by her husband to come to  the emergency room, but declined.  Since this particular episode, she  has had no further chest pain.  Of note, she has had no exertional  symptoms.  She has experience some pain under the left breast and has a  skin infection, for which she has been taking Doxycycline, but she  clearly felt that this pain was distinctly different from what she had  been experiencing earlier.   The patient, unfortunately continues to smoke.  She also has  intolerance, reportedly, to STATINS.  She denies any orthopnea or PND.  She has no palpitations or syncope.   MEDICATIONS:  Multivitamin.  Citrucel.  Ogen.   B.C. Powder, which she  takes for arthritis.   ALLERGIES:  Includes intolerance to STATINS.   SOCIAL HISTORY AND FAMILY HISTORY:  Reviewed.  The patient lives in [place]  with her husband.  She has no significant family history.  She does  smoke, but does not drink any alcohol.   PAST MEDICAL HISTORY:  Has been reviewed and it includes coronary artery  disease, as outlined above, as well as a questionable history of mitral  valve prolapse.  The patient also has dyslipidemia and colon cancer,  status post hemicolectomy.  She also had remote lumbar disk surgery in  the past.   PHYSICAL EXAMINATION:  VITAL SIGNS:  Blood pressure 106/62, heart rate  68, weight 114 pounds.  GENERAL:  A well-nourished white female in no acute distress.  HEENT:  Pupils round.  Conjunctivae and sclerae clear.  NECK:  Supple.  Normal carotid upstroke and no carotid bruits.  LUNGS:  Clear bilaterally.  HEART:  Regular rate and rhythm.  Normal S1 and S2.  No murmurs, rubs,  or  gallops.  ABDOMEN:  Soft and nontender.  No rebound or guarding.  Good bowel  sounds.  EXTREMITIES:  No cyanosis, clubbing, or edema.  NEURO:  The patient is alert and oriented, grossly nonfocal.   A 12-lead echocardiogram obtained in Dr. Magnus Ivan office demonstrates  normal sinus rhythm with occasional PACs, but no acute ischemic change  or prior infarct pattern.   PROBLEMS:  1. Substernal chest pain.      a.     History of nonobstructive coronary artery disease with a 50%       osteal right coronary artery lesion.      b.     Normal left ventricular systolic function.  2. History of palpitations, currently resolved.  3. Dyslipidemia.  Currently not on lipid-lowering therapy.  4. Tobacco use.  5. Previous back surgery.  6. History of colon cancer status post hemicolectomy.   PLAN:  1. The patient's symptoms of chest pain are rather concerning and      appear to be consistent with angina.  Her symptoms were promptly      removed  with nitroglycerin.  Although if she is not particularly      unstable, I do think she could benefit from a diagnostic      catheterization to reassess her coronary anatomy.  2. The patient has been scheduled for next Monday in our [facility] cath lab.      She was also given a prescription of p.r.n. nitroglycerin in the      meanwhile.  3. I urged the patient to take an aspirin every day as she is not      consistent with taking her B.C. Powder on a daily basis.  4. I discussed with the patient there is some benefit of a cardiac      catheterization and she states she is willing to proceed.     Alexandria Codding, MD,FACC  Electronically Signed    GED/MedQ  DD: 02/12/2006  DT: 02/12/2006  Job #: [id number]   cc:   Weyman Pedro

## 2010-06-07 NOTE — Op Note (Signed)
NAME:  Alexandria Wilson, Alexandria Wilson               ACCOUNT NO.:  [id number]   MEDICAL RECORD NO.:  [id number]          PATIENT TYPE:  AMB   LOCATION:  DAY                           FACILITY:  [facility]   PHYSICIAN:  Lionel December, M.D.    DATE OF BIRTH:  [birth year]   DATE OF PROCEDURE:  09/30/2004  DATE OF DISCHARGE:                                 OPERATIVE REPORT   PROCEDURE:  Colonoscopy.   INDICATIONS:  Alexandria Wilson is a 77 year old Caucasian female with history of colon  carcinoma who underwent sigmoid colon resection over 20 years ago. She had a  surveillance colonoscopy in March this year with removal of multiple polyps.  She had seven snared and four coagulated. Her colonoscopy with her colon was  felt not to be clean and four were coagulated and she still had small polyps  left behind. She is therefore returning for another colonoscopy.   Procedure was reviewed with the patient and informed consent was obtained.   PREMEDICATION:  Versed 2 mg IV.   FINDINGS:  Procedure performed in endoscopy suite. The patient's vital signs  and O2 sat were monitored during procedure and remained stable. The patient  was placed in left lateral position. Rectal examination performed. No  abnormality noted on external or digital exam. Olympus videoscope was placed  in the rectum and advanced into sigmoid colon beyond. Preparation was fair.  She still had a lot of stool scattered throughout the colon. She had  pigmentation of colon consistent with melanosis coli. Scope was passed to  the cecum which was identified by appendiceal stump ileocecal valve.  Pictures taken for the record. As the scope was withdrawn, colonic mucosa  was carefully examined and no polyps were identified. Several areas had to  be washed to examine the underlying mucosa. Rectal mucosa similarly was  normal. Scope was retroflexed to examine anorectal junction. Small  hemorrhoids noted below the dentate line. Endoscope was straightened and  withdrawn. The patient tolerated the procedure well.   FINAL DIAGNOSES:  1.  No polyps noted on today's exam.  The examination was somewhat      compromised by quality of prep.  2.  Melanosis coli.  3.  External hemorrhoids.   RECOMMENDATIONS:  She will resume her usual meds and stay on high-fiber  diet.   She should continue yearly Hemoccults and return for surveillance  colonoscopy in three years from now.      Lionel December, M.D.  Electronically Signed     NR/MEDQ  D:  09/30/2004  T:  09/30/2004  Job:  [id number]   cc:   Winn Jock. Eliberto Ivory, MD  [place], [place]

## 2010-06-07 NOTE — Discharge Summary (Signed)
NAME:  Alexandria Wilson, Alexandria Wilson                         ACCOUNT NO.:  [id number]   MEDICAL RECORD NO.:  [id number]                   PATIENT TYPE:  INP   LOCATION:  6706                                 FACILITY:  [facility]   PHYSICIAN:  Coletta Memos, M.D.                  DATE OF BIRTH:  [birth year]   DATE OF ADMISSION:  02/10/2003  DATE OF DISCHARGE:  02/17/2003                                 DISCHARGE SUMMARY   ADMISSION DIAGNOSES:  1. Spondylosis, L4-5, L5-S1.  2. Recurrent herniated nucleus pulposus, L4-5.  3. Degenerative disk disease, L4-5, L5-S1.  4. Lumbar radiculopathy.   POSTOPERATIVE DIAGNOSIS:  1. Spondylosis, L4-5, L5-S1.  2. Recurrent herniated nucleus pulposus, L4-5.  3. Degenerative disk disease, L4-5, L5-S1.  4. Lumbar radiculopathy.   PROCEDURE:  L4-S1 posterolateral arthrodesis, L4-S1 pedicle screw fixation.   SURGEON:  Coletta Memos, M.D.   DISCHARGE STATUS:  Alive and well.   COMPLICATIONS:  None.   INDICATIONS:  Alexandria Wilson was admitted to the hospital and taken to the  operating room for decompression of her fecal sac along with disk herniation  causing problems; therefore, she also underwent a redo diskectomy at the L4-  5 level.  Postoperatively, she was doing well.  She was laid flat in bed for  72 hours secondary to a dural leak during the operation.  She has not had  any evidence of fluid from the wound.  The wound has actually healed quite  well without signs of infection.  She has good strength in the lower  extremities.  Is voiding and tolerating a regular diet without difficulty.  She has had a bowel movement prior to discharge.  She still had some pain in  the left leg but states that was not unusual.  She had a large, calcified  mass, where the disk may have been, quite frankly, for years, as this has  been present on her MRI for a number of years.   At discharge, she was given Percocet 7.5/325 for pain and Flexeril for  muscle relaxant.  She was  given instructions to no heavy bending, lifting,  or twisting.  She was given instructions to also call the office for a  routine followup appointment.                                                Coletta Memos, M.D.    KC/MEDQ  D:  02/17/2003  T:  02/18/2003  Job:  [id number]

## 2010-06-07 NOTE — Op Note (Signed)
NAME:  Alexandria Wilson, Alexandria Wilson NO.:  [id number]   MEDICAL RECORD NO.:  [id number]          PATIENT TYPE:  OIB   LOCATION:  1961                         FACILITY:  [facility]   PHYSICIAN:  Rollene Rotunda, MD, FACCDATE OF BIRTH:  1928-02-14   DATE OF PROCEDURE:  02/16/2006  DATE OF DISCHARGE:                               OPERATIVE REPORT   PRIMARY CARE PHYSICIAN:  Weyman Pedro, M.D.   CARDIOLOGIST:  Learta Codding, MD,FACC.   PROCEDURE:  Left heart catheterization/coronary arteriography.   INDICATIONS:  Evaluate patient with chest pain.  She had previous  nonobstructive disease in her right coronary artery and catheterization  2002.   PROCEDURE:  Left heart catheterization was performed via the right  femoral artery.  The artery was cannulated using anterior wall puncture.  A #4-French arterial sheath was inserted via Seldinger technique.  A  preformed Judkins and a pigtail catheter were utilized.  The patient  tolerated the procedure well and left the lab in stable condition.   RESULTS:  Hemodynamics:  LV 123/4, AO 119/78.   Coronaries:  Left main was normal.  The LAD had proximal 70% stenosis before large  first diagonal.  There was 25% stenosis immediately after the diagonal.  First diagonal was large and had ostial 25% stenosis.  The circumflex in  the AV groove had a mid 25% stenosis before takeoff of a large mid  obtuse marginal.  The mid obtuse marginal was large and branching and  free of high-grade disease.  There was a small posterolateral which was  normal.  The right coronary artery was a dominant vessel.  There was  heavy ostial calcification.  There appeared to be an ostial 25-30%  stenosis.  There was mid 40% stenosis.  There was a moderate size PDA  which was free of disease.  There was a moderate size posterolateral  which was free of disease.   Left ventriculogram:  The left ventriculogram was obtained in the RAO  projection.  The EF was 65% with  normal wall motion.   CONCLUSION:  Moderately high-grade proximal left anterior descending  disease.  Compared with 2002, this is a new lesion.  There is also a new  proximal to mid right coronary artery lesion.  The ostial right coronary  artery calcification is unchanged.   PLAN:  Based on the above, I will review these films with my  interventional partners.  I have discussed this with Dr. Andee Lineman.  The  question will be proceeding with percutaneous revascularization given  her episode of pain versus gathering more objective evidence of ischemia  with this lesion via stress perfusion study.      Rollene Rotunda, MD, [facility]  Electronically Signed     JH/MEDQ  D:  02/16/2006  T:  02/16/2006  Job:  [id number]   cc:   Garnet Sierras, MD,FACC

## 2010-11-21 ENCOUNTER — Encounter (INDEPENDENT_AMBULATORY_CARE_PROVIDER_SITE_OTHER): Payer: Self-pay | Admitting: Internal Medicine

## 2010-12-03 ENCOUNTER — Ambulatory Visit (INDEPENDENT_AMBULATORY_CARE_PROVIDER_SITE_OTHER): Payer: Self-pay | Admitting: Internal Medicine

## 2012-08-03 ENCOUNTER — Other Ambulatory Visit (INDEPENDENT_AMBULATORY_CARE_PROVIDER_SITE_OTHER): Payer: Self-pay | Admitting: *Deleted

## 2012-08-03 ENCOUNTER — Telehealth (INDEPENDENT_AMBULATORY_CARE_PROVIDER_SITE_OTHER): Payer: Self-pay | Admitting: *Deleted

## 2012-08-03 DIAGNOSIS — Z86010 Personal history of colonic polyps: Secondary | ICD-10-CM

## 2012-08-03 DIAGNOSIS — Z85038 Personal history of other malignant neoplasm of large intestine: Secondary | ICD-10-CM

## 2012-08-03 DIAGNOSIS — Z1211 Encounter for screening for malignant neoplasm of colon: Secondary | ICD-10-CM

## 2012-08-03 DIAGNOSIS — Z800 Family history of malignant neoplasm of digestive organs: Secondary | ICD-10-CM

## 2012-08-03 MED ORDER — PEG-KCL-NACL-NASULF-NA ASC-C 100 G PO SOLR: 1.0000 | Freq: Once | ORAL | Status: DC

## 2012-08-03 NOTE — Telephone Encounter (Signed)
Patient needs movi prep 

## 2012-08-19 ENCOUNTER — Telehealth (INDEPENDENT_AMBULATORY_CARE_PROVIDER_SITE_OTHER): Payer: Self-pay | Admitting: *Deleted

## 2012-08-19 NOTE — Telephone Encounter (Signed)
  Procedure: tcs  Reason/Indication:  Hx polyps, hx colon ca, fam hx colon ca  Has patient had this procedure before?  Yes, 2009 (paper chart)  If so, when, by whom and where?    Is there a family history of colon cancer?  Yes, daughter  Who?  What age when diagnosed?    Is patient diabetic?   no      Does patient have prosthetic heart valve?  no  Do you have a pacemaker?  no  Has patient ever had endocarditis? no  Has patient had joint replacement within last 12 months?  no  Is patient on Coumadin, Plavix and/or Aspirin? yes  Medications: citalopram 10 mg daily, plavix 75 mg daily, asa 325 mg daily, centrum silver daily, aleve prn, omeprazole 20 mg daily  Allergies: nkdas  Medication Adjustment: asa 2 days, plavix 5 days  Procedure date & time: 09/09/12 at 1030

## 2012-08-20 NOTE — Telephone Encounter (Signed)
agree

## 2012-08-24 ENCOUNTER — Encounter (HOSPITAL_COMMUNITY): Payer: Self-pay | Admitting: Pharmacy Technician

## 2012-09-07 ENCOUNTER — Encounter (INDEPENDENT_AMBULATORY_CARE_PROVIDER_SITE_OTHER): Payer: Self-pay | Admitting: *Deleted

## 2012-09-29 ENCOUNTER — Telehealth (INDEPENDENT_AMBULATORY_CARE_PROVIDER_SITE_OTHER): Payer: Self-pay | Admitting: *Deleted

## 2012-09-29 NOTE — Telephone Encounter (Signed)
  Procedure: tcs  Reason/Indication:  Hx polyps, hx colon ca, fam hx colon ca  Has patient had this procedure before?  Yes, 2009 (paper chart)  If so, when, by whom and where?    Is there a family history of colon cancer?  Yes, daughter  Who?  What age when diagnosed?    Is patient diabetic?   no      Does patient have prosthetic heart valve?  no  Do you have a pacemaker?  no  Has patient ever had endocarditis? no  Has patient had joint replacement within last 12 months?  no  Is patient on Coumadin, Plavix and/or Aspirin? yes  Medications: see EPIC   Allergies: see EPIC  Medication Adjustment: plavix 5 days, asa 2 days  Procedure date & time: 10/27/12 at 830

## 2012-09-30 NOTE — Telephone Encounter (Signed)
agree

## 2012-10-27 ENCOUNTER — Ambulatory Visit (HOSPITAL_COMMUNITY)
Admission: RE | Admit: 2012-10-27 | Discharge: 2012-10-27 | Disposition: A | Discharge status: Home / Self Care | Payer: Medicare Other | Source: Ambulatory Visit | Attending: Internal Medicine | Admitting: Internal Medicine

## 2012-10-27 ENCOUNTER — Encounter (HOSPITAL_COMMUNITY)
Admission: RE | Disposition: A | Discharge status: Home / Self Care | Payer: Self-pay | Source: Ambulatory Visit | Attending: Internal Medicine

## 2012-10-27 ENCOUNTER — Encounter (HOSPITAL_COMMUNITY): Payer: Self-pay | Admitting: *Deleted

## 2012-10-27 DIAGNOSIS — Z860100 Personal history of colon polyps, unspecified: Secondary | ICD-10-CM

## 2012-10-27 DIAGNOSIS — Z85038 Personal history of other malignant neoplasm of large intestine: Secondary | ICD-10-CM

## 2012-10-27 DIAGNOSIS — D126 Benign neoplasm of colon, unspecified: Secondary | ICD-10-CM | POA: Insufficient documentation

## 2012-10-27 DIAGNOSIS — K644 Residual hemorrhoidal skin tags: Secondary | ICD-10-CM

## 2012-10-27 DIAGNOSIS — K5730 Diverticulosis of large intestine without perforation or abscess without bleeding: Secondary | ICD-10-CM | POA: Insufficient documentation

## 2012-10-27 DIAGNOSIS — Z800 Family history of malignant neoplasm of digestive organs: Secondary | ICD-10-CM

## 2012-10-27 HISTORY — DX: Depression, unspecified: F32.A

## 2012-10-27 HISTORY — DX: Gastro-esophageal reflux disease without esophagitis: K21.9

## 2012-10-27 HISTORY — DX: Major depressive disorder, single episode, unspecified: F32.9

## 2012-10-27 HISTORY — DX: Angina pectoris, unspecified: I20.9

## 2012-10-27 HISTORY — DX: Malignant (primary) neoplasm, unspecified: C80.1

## 2012-10-27 HISTORY — DX: Headache: R51

## 2012-10-27 HISTORY — PX: COLONOSCOPY: SHX5424

## 2012-10-27 HISTORY — DX: Unspecified osteoarthritis, unspecified site: M19.90

## 2012-10-27 HISTORY — DX: Atherosclerotic heart disease of native coronary artery without angina pectoris: I25.10

## 2012-10-27 HISTORY — DX: Anemia, unspecified: D64.9

## 2012-10-27 SURGERY — COLONOSCOPY
Anesthesia: Moderate Sedation

## 2012-10-27 MED ORDER — MIDAZOLAM HCL 5 MG/5ML IJ SOLN
INTRAMUSCULAR | Status: AC
  Filled 2012-10-27: qty 10

## 2012-10-27 MED ORDER — SIMETHICONE 40 MG/0.6ML PO SUSP
ORAL | Status: DC | PRN
  Administered 2012-10-27: 09:00:00

## 2012-10-27 MED ORDER — MIDAZOLAM HCL 5 MG/5ML IJ SOLN
INTRAMUSCULAR | Status: DC | PRN
  Administered 2012-10-27: 2 mg via INTRAVENOUS
  Administered 2012-10-27: 1 mg via INTRAVENOUS

## 2012-10-27 MED ORDER — MEPERIDINE HCL 50 MG/ML IJ SOLN
INTRAMUSCULAR | Status: AC
  Filled 2012-10-27: qty 1

## 2012-10-27 MED ORDER — SODIUM CHLORIDE 0.9 % IV SOLN
INTRAVENOUS | Status: DC
  Administered 2012-10-27: 1000 mL via INTRAVENOUS

## 2012-10-27 NOTE — Op Note (Signed)
COLONOSCOPY PROCEDURE REPORT  PATIENT:  Alexandria Wilson  MR#:  [id number] Birthdate:  26-Nov-1928, 79 y.o., female Endoscopist:  Dr. Malissa Hippo, MD Referred By:  Dr. Ignatius Specking, MD Procedure Date: 10/27/2012  Procedure:   Colonoscopy  Indications:  Patient is an 79 year old Caucasian female with history of colon carcinoma and history of colonic adenomas who is here for surveillance colonoscopy. Patient's last exam was  5 years ago.  Informed Consent:  The procedure and risks were reviewed with the patient and informed consent was obtained.  Medications:  Versed 3 mg IV  Description of procedure:  After a digital rectal exam was performed, that colonoscope was advanced from the anus through the rectum and colon to the area of the cecum, ileocecal valve and appendiceal orifice. The cecum was deeply intubated. These structures were well-seen and photographed for the record. From the level of the cecum and ileocecal valve, the scope was slowly and cautiously withdrawn. The mucosal surfaces were carefully surveyed utilizing scope tip to flexion to facilitate fold flattening as needed. The scope was pulled down into the rectum where a thorough exam including retroflexion was performed.  Findings:   Prep satisfactory. Diffuse mucosal pigmentation consistent with melanosis coli. Two sessile polyps noted at cecum. Biopsy taken for histology and the pseudopolyps or coagulated with argon plasma coagulator.  Larger of the two polyps was about  12 mm in maximal diameter. Few small diverticula in transverse colon. Small polyp at hepatic flexure was coagulated with APC. Small polyp at sigmoid colon was coagulated with APC. Normal rectal mucosa. 2 anal papillae and small hemorrhoids below the dentate line.   Therapeutic/Diagnostic Maneuvers Performed:  See above  Complications:  None  Cecal Withdrawal Time:  23 minutes  Impression:  Examination performed to cecum. Diffuse changes of  melanosis coli. Few tiny diverticula at transverse colon. Two sessile polyps at cecum. Biopsy taken for histology and these polyps were coagulated with argon plasma coagulator. Two other small polyps were ablated with argon Bosma coagulator. These are located at hepatic flexure and sigmoid colon. External hemorrhoids and two anal papillae.   Recommendations:  Standard instructions given. Patient will resume clopidogrel on 10/30/2012. I will contact patient with biopsy results and further recommendations.  Milen Lengacher U  10/27/2012 9:22 AM  CC: Dr. Ignatius Specking., MD & Dr. Bonnetta Barry ref. provider found

## 2012-10-27 NOTE — Discharge Instructions (Signed)
No aspirin or NSAIDs for one week. Resume clopidogrel or Plavix on 10/30/2012. Resume other medications as before. No driving for 24 hours. Physician will contact you with biopsy results   Colon Polyps Polyps are lumps of extra tissue growing inside the body. Polyps can grow in the large intestine (colon). Most colon polyps are noncancerous (benign). However, some colon polyps can become cancerous over time. Polyps that are larger than a pea may be harmful. To be safe, caregivers remove and test all polyps. CAUSES  Polyps form when mutations in the genes cause your cells to grow and divide even though no more tissue is needed. RISK FACTORS There are a number of risk factors that can increase your chances of getting colon polyps. They include:  Being older than 50 years.  Family history of colon polyps or colon cancer.  Long-term colon diseases, such as colitis or Crohn disease.  Being overweight.  Smoking.  Being inactive.  Drinking too much alcohol. SYMPTOMS  Most small polyps do not cause symptoms. If symptoms are present, they may include:  Blood in the stool. The stool may look dark red or black.  Constipation or diarrhea that lasts longer than 1 week. DIAGNOSIS People often do not know they have polyps until their caregiver finds them during a regular checkup. Your caregiver can use 4 tests to check for polyps:  Digital rectal exam. The caregiver wears gloves and feels inside the rectum. This test would find polyps only in the rectum.  Barium enema. The caregiver puts a liquid called barium into your rectum before taking X-rays of your colon. Barium makes your colon look white. Polyps are dark, so they are easy to see in the X-ray pictures.  Sigmoidoscopy. A thin, flexible tube (sigmoidoscope) is placed into your rectum. The sigmoidoscope has a light and tiny camera in it. The caregiver uses the sigmoidoscope to look at the last third of your colon.  Colonoscopy. This  test is like sigmoidoscopy, but the caregiver looks at the entire colon. This is the most common method for finding and removing polyps. TREATMENT  Any polyps will be removed during a sigmoidoscopy or colonoscopy. The polyps are then tested for cancer. PREVENTION  To help lower your risk of getting more colon polyps:  Eat plenty of fruits and vegetables. Avoid eating fatty foods.  Do not smoke.  Avoid drinking alcohol.  Exercise every day.  Lose weight if recommended by your caregiver.  Eat plenty of calcium and folate. Foods that are rich in calcium include milk, cheese, and broccoli. Foods that are rich in folate include chickpeas, kidney beans, and spinach. HOME CARE INSTRUCTIONS Keep all follow-up appointments as directed by your caregiver. You may need periodic exams to check for polyps. SEEK MEDICAL CARE IF: You notice bleeding during a bowel movement. Document Released: 10/03/2003 Document Revised: 03/31/2011 Document Reviewed: 03/18/2011 [facility] Patient Information 2014 [place], [place].

## 2012-10-27 NOTE — H&P (Signed)
Alexandria Wilson is an 79 y.o. female.   Chief Complaint: Patient is here for colonoscopy. HPI: Patient is an  79 year old Caucasian female with history of colonic carcinoma and adenomas who is here for surveillance colonoscopy. She had sigmoid resection back in 1986. Her last colonoscopy was 5 years ago. She denies rectal bleeding abdominal pain change in bowel habits. She has lost close to 12 pounds in the last few months. She denies nausea vomiting fever chills or night sweats. Her husband was diagnosed with dementia and is not doing well. She is in to see in his care and believes she's not eating like she should. Family history is negative for CRC.  Past Medical History  Diagnosis Date  . Coronary artery disease     3 stents  . Anginal pain   . Depression   . GERD (gastroesophageal reflux disease)   . Headache(784.0)   . Cancer     colon  . Arthritis   . Anemia     hx of transfusion    Past Surgical History  Procedure Laterality Date  . Colon surgery    . Eye surgery    . Cardiac catheterization      3 stents  . Abdominal hysterectomy    . Back surgery      x 2  . Fracture surgery      right wrist  . Appendectomy      Family History  Problem Relation Age of Onset  . Alzheimer's disease Mother   . CAD Father   . COPD Brother   . Congestive Heart Failure Brother    Social History:  reports that she quit smoking about 24 years ago. She does not have any smokeless tobacco history on file. She reports that she does not drink alcohol or use illicit drugs.  Allergies: No Known Allergies  Medications Prior to Admission  Medication Sig Dispense Refill  . Aspirin-Salicylamide-Caffeine (BC FAST PAIN RELIEF) 650-195-33.3 MG PACK Take 1 packet by mouth daily.      . citalopram (CELEXA) 10 MG tablet Take 10 mg by mouth daily.      . clopidogrel (PLAVIX) 75 MG tablet Take 75 mg by mouth daily.      . Multiple Vitamin (MULTIVITAMIN) tablet Take 1 tablet by mouth daily.      Marland Kitchen  omeprazole (PRILOSEC) 10 MG capsule Take 10 mg by mouth as needed.      . peg 3350 powder (MOVIPREP) 100 G SOLR Take 1 kit (100 g total) by mouth once.  1 kit  0  . naproxen sodium (ALEVE) 220 MG tablet Take 220 mg by mouth as needed. Back pain        No results found for this or any previous visit (from the past 48 hour(s)). No results found.  ROS  Blood pressure 101/43, pulse 67, temperature 98 F (36.7 C), temperature source Oral, resp. rate 18, height 4' 11.5" (1.511 m), weight 95 lb (43.092 kg), SpO2 98.00%. Physical Exam  Constitutional:  Thin Caucasian female in NAD.  HENT:  Mouth/Throat: Oropharynx is clear and moist.  Eyes: Conjunctivae are normal. No scleral icterus.  Neck: No thyromegaly present.  Cardiovascular: Normal rate, regular rhythm and normal heart sounds.   No murmur heard. Respiratory: Effort normal and breath sounds normal.  GI: Soft. She exhibits no distension and no mass. There is no tenderness.  Musculoskeletal: She exhibits no edema.  Lymphadenopathy:    She has no cervical adenopathy.  Neurological: She is alert.  Skin: Skin is warm and dry.     Assessment/Plan History of colonic adenomas and colon carcinoma. Surveillance colonoscopy. Reason weight loss possibly due to diminished calorie intake.  REHMAN,NAJEEB U 10/27/2012, 8:29 AM

## 2012-11-01 ENCOUNTER — Encounter (HOSPITAL_COMMUNITY): Payer: Self-pay | Admitting: Internal Medicine

## 2012-11-04 ENCOUNTER — Encounter (INDEPENDENT_AMBULATORY_CARE_PROVIDER_SITE_OTHER): Payer: Self-pay | Admitting: *Deleted

## 2012-11-26 ENCOUNTER — Encounter (INDEPENDENT_AMBULATORY_CARE_PROVIDER_SITE_OTHER): Payer: Self-pay

## 2013-07-25 ENCOUNTER — Emergency Department (HOSPITAL_COMMUNITY): Payer: Medicare Other

## 2013-07-25 ENCOUNTER — Emergency Department (HOSPITAL_COMMUNITY)
Admission: EM | Admit: 2013-07-25 | Discharge: 2013-07-25 | Disposition: A | Discharge status: Home / Self Care | Payer: Medicare Other | Attending: Emergency Medicine | Admitting: Emergency Medicine

## 2013-07-25 ENCOUNTER — Encounter (HOSPITAL_COMMUNITY): Payer: Self-pay | Admitting: Emergency Medicine

## 2013-07-25 DIAGNOSIS — S0240CB Maxillary fracture, right side, initial encounter for open fracture: Secondary | ICD-10-CM

## 2013-07-25 DIAGNOSIS — S0010XA Contusion of unspecified eyelid and periocular area, initial encounter: Secondary | ICD-10-CM | POA: Insufficient documentation

## 2013-07-25 DIAGNOSIS — S46909A Unspecified injury of unspecified muscle, fascia and tendon at shoulder and upper arm level, unspecified arm, initial encounter: Secondary | ICD-10-CM | POA: Insufficient documentation

## 2013-07-25 DIAGNOSIS — I2510 Atherosclerotic heart disease of native coronary artery without angina pectoris: Secondary | ICD-10-CM | POA: Insufficient documentation

## 2013-07-25 DIAGNOSIS — S60221A Contusion of right hand, initial encounter: Secondary | ICD-10-CM

## 2013-07-25 DIAGNOSIS — S02401A Maxillary fracture, unspecified, initial encounter for closed fracture: Principal | ICD-10-CM | POA: Insufficient documentation

## 2013-07-25 DIAGNOSIS — I209 Angina pectoris, unspecified: Secondary | ICD-10-CM | POA: Insufficient documentation

## 2013-07-25 DIAGNOSIS — R05 Cough: Secondary | ICD-10-CM | POA: Insufficient documentation

## 2013-07-25 DIAGNOSIS — F329 Major depressive disorder, single episode, unspecified: Secondary | ICD-10-CM | POA: Insufficient documentation

## 2013-07-25 DIAGNOSIS — S60229A Contusion of unspecified hand, initial encounter: Secondary | ICD-10-CM | POA: Insufficient documentation

## 2013-07-25 DIAGNOSIS — S4980XA Other specified injuries of shoulder and upper arm, unspecified arm, initial encounter: Secondary | ICD-10-CM | POA: Insufficient documentation

## 2013-07-25 DIAGNOSIS — R197 Diarrhea, unspecified: Secondary | ICD-10-CM | POA: Insufficient documentation

## 2013-07-25 DIAGNOSIS — R42 Dizziness and giddiness: Secondary | ICD-10-CM | POA: Insufficient documentation

## 2013-07-25 DIAGNOSIS — S0180XA Unspecified open wound of other part of head, initial encounter: Secondary | ICD-10-CM | POA: Insufficient documentation

## 2013-07-25 DIAGNOSIS — Z8719 Personal history of other diseases of the digestive system: Secondary | ICD-10-CM | POA: Insufficient documentation

## 2013-07-25 DIAGNOSIS — Y9301 Activity, walking, marching and hiking: Secondary | ICD-10-CM | POA: Insufficient documentation

## 2013-07-25 DIAGNOSIS — Z9861 Coronary angioplasty status: Secondary | ICD-10-CM | POA: Insufficient documentation

## 2013-07-25 DIAGNOSIS — M129 Arthropathy, unspecified: Secondary | ICD-10-CM | POA: Insufficient documentation

## 2013-07-25 DIAGNOSIS — Z87891 Personal history of nicotine dependence: Secondary | ICD-10-CM | POA: Insufficient documentation

## 2013-07-25 DIAGNOSIS — S0181XA Laceration without foreign body of other part of head, initial encounter: Secondary | ICD-10-CM

## 2013-07-25 DIAGNOSIS — Z7902 Long term (current) use of antithrombotics/antiplatelets: Secondary | ICD-10-CM | POA: Insufficient documentation

## 2013-07-25 DIAGNOSIS — H05231 Hemorrhage of right orbit: Secondary | ICD-10-CM

## 2013-07-25 DIAGNOSIS — F3289 Other specified depressive episodes: Secondary | ICD-10-CM | POA: Insufficient documentation

## 2013-07-25 DIAGNOSIS — R059 Cough, unspecified: Secondary | ICD-10-CM | POA: Insufficient documentation

## 2013-07-25 DIAGNOSIS — W19XXXA Unspecified fall, initial encounter: Secondary | ICD-10-CM

## 2013-07-25 DIAGNOSIS — Z85038 Personal history of other malignant neoplasm of large intestine: Secondary | ICD-10-CM | POA: Insufficient documentation

## 2013-07-25 DIAGNOSIS — Y9229 Other specified public building as the place of occurrence of the external cause: Secondary | ICD-10-CM | POA: Insufficient documentation

## 2013-07-25 DIAGNOSIS — Z79899 Other long term (current) drug therapy: Secondary | ICD-10-CM | POA: Insufficient documentation

## 2013-07-25 DIAGNOSIS — Z9889 Other specified postprocedural states: Secondary | ICD-10-CM | POA: Insufficient documentation

## 2013-07-25 DIAGNOSIS — S0285XA Fracture of orbit, unspecified, initial encounter for closed fracture: Secondary | ICD-10-CM

## 2013-07-25 DIAGNOSIS — S0280XA Fracture of other specified skull and facial bones, unspecified side, initial encounter for closed fracture: Secondary | ICD-10-CM | POA: Insufficient documentation

## 2013-07-25 DIAGNOSIS — Z862 Personal history of diseases of the blood and blood-forming organs and certain disorders involving the immune mechanism: Secondary | ICD-10-CM | POA: Insufficient documentation

## 2013-07-25 DIAGNOSIS — S02400A Malar fracture unspecified, initial encounter for closed fracture: Secondary | ICD-10-CM | POA: Insufficient documentation

## 2013-07-25 DIAGNOSIS — S02400B Malar fracture unspecified, initial encounter for open fracture: Secondary | ICD-10-CM | POA: Insufficient documentation

## 2013-07-25 DIAGNOSIS — S02401B Maxillary fracture, unspecified, initial encounter for open fracture: Secondary | ICD-10-CM | POA: Insufficient documentation

## 2013-07-25 DIAGNOSIS — S02402A Zygomatic fracture, unspecified, initial encounter for closed fracture: Secondary | ICD-10-CM

## 2013-07-25 LAB — COMPREHENSIVE METABOLIC PANEL
ALK PHOS: 54 U/L (ref 39–117)
ALT: 14 U/L (ref 0–35)
ANION GAP: 10 (ref 5–15)
AST: 18 U/L (ref 0–37)
Albumin: 3.7 g/dL (ref 3.5–5.2)
BUN: 9 mg/dL (ref 6–23)
CO2: 27 mEq/L (ref 19–32)
Calcium: 9.2 mg/dL (ref 8.4–10.5)
Chloride: 106 mEq/L (ref 96–112)
Creatinine, Ser: 0.58 mg/dL (ref 0.50–1.10)
GFR calc non Af Amer: 83 mL/min — ABNORMAL LOW (ref >90)
GLUCOSE: 88 mg/dL (ref 70–99)
POTASSIUM: 4.2 meq/L (ref 3.7–5.3)
SODIUM: 143 meq/L (ref 137–147)
Total Protein: 6.2 g/dL (ref 6.0–8.3)

## 2013-07-25 LAB — URINALYSIS, ROUTINE W REFLEX MICROSCOPIC
Bilirubin Urine: NEGATIVE
Glucose, UA: NEGATIVE mg/dL
HGB URINE DIPSTICK: NEGATIVE
KETONES UR: NEGATIVE mg/dL
Leukocytes, UA: NEGATIVE
NITRITE: NEGATIVE
Protein, ur: NEGATIVE mg/dL
Specific Gravity, Urine: 1.011 (ref 1.005–1.030)
UROBILINOGEN UA: 0.2 mg/dL (ref 0.0–1.0)
pH: 5.5 (ref 5.0–8.0)

## 2013-07-25 LAB — CBC WITH DIFFERENTIAL/PLATELET
Basophils Absolute: 0 10*3/uL (ref 0.0–0.1)
Basophils Relative: 0 % (ref 0–1)
Eosinophils Absolute: 0.3 10*3/uL (ref 0.0–0.7)
Eosinophils Relative: 5 % (ref 0–5)
HCT: 36.4 % (ref 36.0–46.0)
Hemoglobin: 11.8 g/dL — ABNORMAL LOW (ref 12.0–15.0)
LYMPHS ABS: 1.2 10*3/uL (ref 0.7–4.0)
Lymphocytes Relative: 24 % (ref 12–46)
MCH: 31.6 pg (ref 26.0–34.0)
MCHC: 32.4 g/dL (ref 30.0–36.0)
MCV: 97.6 fL (ref 78.0–100.0)
Monocytes Absolute: 0.5 10*3/uL (ref 0.1–1.0)
Monocytes Relative: 10 % (ref 3–12)
NEUTROS ABS: 3 10*3/uL (ref 1.7–7.7)
NEUTROS PCT: 61 % (ref 43–77)
PLATELETS: 146 10*3/uL — AB (ref 150–400)
RBC: 3.73 MIL/uL — AB (ref 3.87–5.11)
RDW: 13.9 % (ref 11.5–15.5)
WBC: 5 10*3/uL (ref 4.0–10.5)

## 2013-07-25 MED ORDER — CEPHALEXIN 500 MG PO CAPS: 500.0000 mg | ORAL_CAPSULE | Freq: Four times a day (QID) | ORAL | Status: DC

## 2013-07-25 NOTE — ED Notes (Signed)
Pt sts she doesn't remember tripping or falling, just remembers walking and then next thing she knew is that everyone was around her. Pt denies new back pain. Pt c/o right lateral neck pain x 4 days. Denies cp/sob. Nad, skin warm and dry, resp e/u.

## 2013-07-25 NOTE — Discharge Instructions (Signed)
Read the information below.  Use the prescribed medication as directed.  Please discuss all new medications with your pharmacist.  You may return to the Emergency Department at any time for worsening condition or any new symptoms that concern you.    Please see your doctor or Dr Benson Norway in 5-7 days for wound check and suture removal.  If you develop redness, swelling, pus draining from the wound, or fevers greater than 100.4, return to the ER immediately for a recheck.    Please see Dr Benson Norway in 7-10 days for follow up on the fractured bones in your face.  If you have difficulty moving your eyes or change in your vision, call him immediately for follow up.  If you are unable to get in touch, return to the ER.  If you develop severe headache, difficulty speaking or walking, or weakness or numbness in your arms or legs, return to the ER immediately for a recheck.    You have had a head injury which does not appear to require admission at this time. A concussion is a state of changed mental ability from trauma. SEEK IMMEDIATE MEDICAL ATTENTION IF: There is confusion or drowsiness (although children frequently become drowsy after injury).  You cannot awaken the injured person.  There is nausea (feeling sick to your stomach) or continued, forceful vomiting.  You notice dizziness or unsteadiness which is getting worse, or inability to walk.  You have convulsions or unconsciousness.  You experience severe, persistent headaches not relieved by Tylenol?. (Do not take aspirin as this impairs clotting abilities). Take other pain medications only as directed.  You cannot use arms or legs normally.  There are changes in pupil sizes. (This is the black center in the colored part of the eye)  There is clear or bloody discharge from the nose or ears.  Change in speech, vision, swallowing, or understanding.  Localized weakness, numbness, tingling, or change in bowel or bladder control.     Facial Laceration  A facial laceration is a cut on the face. These injuries can be painful and cause bleeding. Lacerations usually heal quickly, but they need special care to reduce scarring. DIAGNOSIS  Your health care provider will take a medical history, ask for details about how the injury occurred, and examine the wound to determine how deep the cut is. TREATMENT  Some facial lacerations may not require closure. Others may not be able to be closed because of an increased risk of infection. The risk of infection and the chance for successful closure will depend on various factors, including the amount of time since the injury occurred. The wound may be cleaned to help prevent infection. If closure is appropriate, pain medicines may be given if needed. Your health care provider will use stitches (sutures), wound glue (adhesive), or skin adhesive strips to repair the laceration. These tools bring the skin edges together to allow for faster healing and a better cosmetic outcome. If needed, you may also be given a tetanus shot. HOME CARE INSTRUCTIONS  Only take over-the-counter or prescription medicines as directed by your health care provider.  Follow your health care provider's instructions for wound care. These instructions will vary depending on the technique used for closing the wound. For Sutures:  Keep the wound clean and dry.   If you were given a bandage (dressing), you should change it at least once a day. Also change the dressing if it becomes wet or dirty, or as directed by your health care provider.  Wash the wound with soap and water 2 times a day. Rinse the wound off with water to remove all soap. Pat the wound dry with a clean towel.   After cleaning, apply a thin layer of the antibiotic ointment recommended by your health care provider. This will help prevent infection and keep the dressing from sticking.   You may shower as usual after the first 24 hours. Do not soak the wound in water until  the sutures are removed.   Get your sutures removed as directed by your health care provider. With facial lacerations, sutures should usually be taken out after 4-5 days to avoid stitch marks.   Wait a few days after your sutures are removed before applying any makeup. For Skin Adhesive Strips:  Keep the wound clean and dry.   Do not get the skin adhesive strips wet. You may bathe carefully, using caution to keep the wound dry.   If the wound gets wet, pat it dry with a clean towel.   Skin adhesive strips will fall off on their own. You may trim the strips as the wound heals. Do not remove skin adhesive strips that are still stuck to the wound. They will fall off in time.  For Wound Adhesive:  You may briefly wet your wound in the shower or bath. Do not soak or scrub the wound. Do not swim. Avoid periods of heavy sweating until the skin adhesive has fallen off on its own. After showering or bathing, gently pat the wound dry with a clean towel.   Do not apply liquid medicine, cream medicine, ointment medicine, or makeup to your wound while the skin adhesive is in place. This may loosen the film before your wound is healed.   If a dressing is placed over the wound, be careful not to apply tape directly over the skin adhesive. This may cause the adhesive to be pulled off before the wound is healed.   Avoid prolonged exposure to sunlight or tanning lamps while the skin adhesive is in place.  The skin adhesive will usually remain in place for 5-10 days, then naturally fall off the skin. Do not pick at the adhesive film.  After Healing: Once the wound has healed, cover the wound with sunscreen during the day for 1 full year. This can help minimize scarring. Exposure to ultraviolet light in the first year will darken the scar. It can take 1-2 years for the scar to lose its redness and to heal completely.  SEEK IMMEDIATE MEDICAL CARE IF:  You have redness, pain, or swelling around the  wound.   You see ayellowish-white fluid (pus) coming from the wound.   You have chills or a fever.  MAKE SURE YOU:  Understand these instructions.  Will watch your condition.  Will get help right away if you are not doing well or get worse. Document Released: 02/14/2004 Document Revised: 10/27/2012 Document Reviewed: 08/19/2012 [facility] Patient Information 2015 [place], [place]. This information is not intended to replace advice given to you by your health care provider. Make sure you discuss any questions you have with your health care provider.  Facial Fracture A facial fracture is a break in one of the bones of your face. HOME CARE INSTRUCTIONS   Protect the injured part of your face until it is healed.  Do not participate in activities which give chance for re-injury until your doctor approves.  Gently wash and dry your face.  Wear head and facial protection while riding  a bicycle, motorcycle, or snowmobile. SEEK MEDICAL CARE IF:   An oral temperature above 102 F (38.9 C) develops.  You have severe headaches or notice changes in your vision.  You have new numbness or tingling in your face.  You develop nausea (feeling sick to your stomach), vomiting or a stiff neck. SEEK IMMEDIATE MEDICAL CARE IF:   You develop difficulty seeing or experience double vision.  You become dizzy, lightheaded, or faint.  You develop trouble speaking, breathing, or swallowing.  You have a watery discharge from your nose or ear. MAKE SURE YOU:   Understand these instructions.  Will watch your condition.  Will get help right away if you are not doing well or get worse. Document Released: 01/06/2005 Document Revised: 03/31/2011 Document Reviewed: 08/26/2007 [facility] Patient Information 2015 [place], [place]. This information is not intended to replace advice given to you by your health care provider. Make sure you discuss any questions you have with your health care  provider.  Fall Prevention and Home Safety Falls cause injuries and can affect all age groups. It is possible to use preventive measures to significantly decrease the likelihood of falls. There are many simple measures which can make your home safer and prevent falls. OUTDOORS  Repair cracks and edges of walkways and driveways.  Remove high doorway thresholds.  Trim shrubbery on the main path into your home.  Have good outside lighting.  Clear walkways of tools, rocks, debris, and clutter.  Check that handrails are not broken and are securely fastened. Both sides of steps should have handrails.  Have leaves, snow, and ice cleared regularly.  Use sand or salt on walkways during winter months.  In the garage, clean up grease or oil spills. BATHROOM  Install night lights.  Install grab bars by the toilet and in the tub and shower.  Use non-skid mats or decals in the tub or shower.  Place a plastic non-slip stool in the shower to sit on, if needed.  Keep floors dry and clean up all water on the floor immediately.  Remove soap buildup in the tub or shower on a regular basis.  Secure bath mats with non-slip, double-sided rug tape.  Remove throw rugs and tripping hazards from the floors. BEDROOMS  Install night lights.  Make sure a bedside light is easy to reach.  Do not use oversized bedding.  Keep a telephone by your bedside.  Have a firm chair with side arms to use for getting dressed.  Remove throw rugs and tripping hazards from the floor. KITCHEN  Keep handles on pots and pans turned toward the center of the stove. Use back burners when possible.  Clean up spills quickly and allow time for drying.  Avoid walking on wet floors.  Avoid hot utensils and knives.  Position shelves so they are not too high or low.  Place commonly used objects within easy reach.  If necessary, use a sturdy step stool with a grab bar when reaching.  Keep electrical cables  out of the way.  Do not use floor polish or wax that makes floors slippery. If you must use wax, use non-skid floor wax.  Remove throw rugs and tripping hazards from the floor. STAIRWAYS  Never leave objects on stairs.  Place handrails on both sides of stairways and use them. Fix any loose handrails. Make sure handrails on both sides of the stairways are as long as the stairs.  Check carpeting to make sure it is firmly attached along stairs.  Make repairs to worn or loose carpet promptly.  Avoid placing throw rugs at the top or bottom of stairways, or properly secure the rug with carpet tape to prevent slippage. Get rid of throw rugs, if possible.  Have an electrician put in a light switch at the top and bottom of the stairs. OTHER FALL PREVENTION TIPS  Wear low-heel or rubber-soled shoes that are supportive and fit well. Wear closed toe shoes.  When using a stepladder, make sure it is fully opened and both spreaders are firmly locked. Do not climb a closed stepladder.  Add color or contrast paint or tape to grab bars and handrails in your home. Place contrasting color strips on first and last steps.  Learn and use mobility aids as needed. Install an electrical emergency response system.  Turn on lights to avoid dark areas. Replace light bulbs that burn out immediately. Get light switches that glow.  Arrange furniture to create clear pathways. Keep furniture in the same place.  Firmly attach carpet with non-skid or double-sided tape.  Eliminate uneven floor surfaces.  Select a carpet pattern that does not visually hide the edge of steps.  Be aware of all pets. OTHER HOME SAFETY TIPS  Set the water temperature for 120 F (48.8 C).  Keep emergency numbers on or near the telephone.  Keep smoke detectors on every level of the home and near sleeping areas. Document Released: 12/27/2001 Document Revised: 07/08/2011 Document Reviewed: 03/28/2011 [facility] Patient Information  2015 [place], [place]. This information is not intended to replace advice given to you by your health care provider. Make sure you discuss any questions you have with your health care provider.  Hematoma A hematoma is a collection of blood under the skin, in an organ, in a body space, in a joint space, or in other tissue. The blood can clot to form a lump that you can see and feel. The lump is often firm and may sometimes become sore and tender. Most hematomas get better in a few days to weeks. However, some hematomas may be serious and require medical care. Hematomas can range in size from very small to very large. CAUSES  A hematoma can be caused by a blunt or penetrating injury. It can also be caused by spontaneous leakage from a blood vessel under the skin. Spontaneous leakage from a blood vessel is more likely to occur in older people, especially those taking blood thinners. Sometimes, a hematoma can develop after certain medical procedures. SIGNS AND SYMPTOMS   A firm lump on the body.  Possible pain and tenderness in the area.  Bruising.Blue, dark blue, purple-red, or yellowish skin may appear at the site of the hematoma if the hematoma is close to the surface of the skin. For hematomas in deeper tissues or body spaces, the signs and symptoms may be subtle. For example, an intra-abdominal hematoma may cause abdominal pain, weakness, fainting, and shortness of breath. An intracranial hematoma may cause a headache or symptoms such as weakness, trouble speaking, or a change in consciousness. DIAGNOSIS  A hematoma can usually be diagnosed based on your medical history and a physical exam. Imaging tests may be needed if your health care provider suspects a hematoma in deeper tissues or body spaces, such as the abdomen, head, or chest. These tests may include ultrasonography or a CT scan.  TREATMENT  Hematomas usually go away on their own over time. Rarely does the blood need to be drained out of  the body. Large hematomas  or those that may affect vital organs will sometimes need surgical drainage or monitoring. HOME CARE INSTRUCTIONS   Apply ice to the injured area:   Put ice in a plastic bag.   Place a towel between your skin and the bag.   Leave the ice on for 20 minutes, 2-3 times a day for the first 1 to 2 days.   After the first 2 days, switch to using warm compresses on the hematoma.   Elevate the injured area to help decrease pain and swelling. Wrapping the area with an elastic bandage may also be helpful. Compression helps to reduce swelling and promotes shrinking of the hematoma. Make sure the bandage is not wrapped too tight.   If your hematoma is on a lower extremity and is painful, crutches may be helpful for a couple days.   Only take over-the-counter or prescription medicines as directed by your health care provider. SEEK IMMEDIATE MEDICAL CARE IF:   You have increasing pain, or your pain is not controlled with medicine.   You have a fever.   You have worsening swelling or discoloration.   Your skin over the hematoma breaks or starts bleeding.   Your hematoma is in your chest or abdomen and you have weakness, shortness of breath, or a change in consciousness.  Your hematoma is on your scalp (caused by a fall or injury) and you have a worsening headache or a change in alertness or consciousness. MAKE SURE YOU:   Understand these instructions.  Will watch your condition.  Will get help right away if you are not doing well or get worse. Document Released: 08/21/2003 Document Revised: 09/08/2012 Document Reviewed: 06/16/2012 [facility] Patient Information 2015 [place], [place]. This information is not intended to replace advice given to you by your health care provider. Make sure you discuss any questions you have with your health care provider.

## 2013-07-25 NOTE — ED Provider Notes (Signed)
CSN: [id number]     Arrival date & time 07/25/13  1552 History   First MD Initiated Contact with Patient 07/25/13 1553     Chief Complaint  Patient presents with  . Fall  . Head Laceration     (Consider location/radiation/quality/duration/timing/severity/associated sxs/prior Treatment) The history is provided by the patient and a friend.    Patient on plavix presents after fall..  States she was walking at a department store and the next thing she remembers were people around her.  Pt was wearing shoes that were too big for her.  Companion does note the floors were slick.  Pt denies any preceding symptoms. Is unsure what happened to cause the fall.  Reports pain over the right side of her face.  Prior to this, notes right sided neck pain x 4 days, felt like she was sleeping wrong.  She has also had mild cough productive of yellow sputum, she feels is residual from URI several weeks ago.  Had frequent diarrhea overnight.  Does get lightheaded if she stands up too quickly, this is not a new symptom.  Denies CP, SOB, abdominal pain, vomiting, urinary symptoms.  Denies dizziness, focal neurologic deficits, visual changes, difficulty moving eyes, leg swelling.  Denies any other new pain except for the right side of her face.   Denies malocclusion.  Last tetanus was 1 year ago.   Past Medical History  Diagnosis Date  . Coronary artery disease     3 stents  . Anginal pain   . Depression   . GERD (gastroesophageal reflux disease)   . Headache(784.0)   . Cancer     colon  . Arthritis   . Anemia     hx of transfusion   Past Surgical History  Procedure Laterality Date  . Colon surgery    . Eye surgery    . Cardiac catheterization      3 stents  . Abdominal hysterectomy    . Back surgery      x 2  . Fracture surgery      right wrist  . Appendectomy    . Colonoscopy N/A 10/27/2012    Procedure: COLONOSCOPY;  Surgeon: Rogene Houston, MD;  [facility]: [facility];  Service: Endoscopy;   Laterality: N/A;  1030-rescheduled to 8:30am Ann to notify pt   Family History  Problem Relation Age of Onset  . Alzheimer's disease Mother   . CAD Father   . COPD Brother   . Congestive Heart Failure Brother    History  Substance Use Topics  . Smoking status: Former Smoker    Quit date: 01/21/1988  . Smokeless tobacco: Not on file  . Alcohol Use: No   OB History   Grav Para Term Preterm Abortions TAB SAB Ect Mult Living                 Review of Systems  All other systems reviewed and are negative.     Allergies  Review of patient's allergies indicates no known allergies.  Home Medications   Prior to Admission medications   Medication Sig Start Date End Date Taking? Authorizing Provider  citalopram (CELEXA) 10 MG tablet Take 10 mg by mouth daily.    Historical Provider, MD  clopidogrel (PLAVIX) 75 MG tablet Take 75 mg by mouth daily.    Historical Provider, MD  Multiple Vitamin (MULTIVITAMIN) tablet Take 1 tablet by mouth daily.    Historical Provider, MD  peg 3350 powder (MOVIPREP) 100 G SOLR  Take 1 kit (100 g total) by mouth once. 08/03/12   Rona Ravens Setzer, NP   BP 133/79  Temp(Src) 97.4 F (36.3 C) (Oral)  Resp 14  Ht _0  (1.499 m)  Wt 91 lb (41.277 kg)  BMI 18.37 kg/m2  SpO2 98% Physical Exam  Nursing note and vitals reviewed. Constitutional: She is oriented to person, place, and time. She appears well-developed and well-nourished. No distress.  HENT:  Head: Normocephalic.    Mouth/Throat: Uvula is midline and oropharynx is clear and moist. Mucous membranes are dry.  Eyes: Conjunctivae and EOM are normal. Right eye exhibits no discharge. Left eye exhibits no discharge.  Neck: Neck supple.  Cardiovascular: Normal rate and regular rhythm.   Pulmonary/Chest: Effort normal and breath sounds normal. No respiratory distress. She has no wheezes. She has no rales.  Abdominal: Soft. She exhibits no distension. There is no tenderness. There is no rebound and no  guarding.  Musculoskeletal:       Right shoulder: She exhibits tenderness. She exhibits no deformity.       Right wrist: Normal. She exhibits no tenderness and no bony tenderness.       Right forearm: Normal.       Arms:      Right hand: She exhibits normal range of motion and no bony tenderness. Normal sensation noted. Normal strength noted.       Hands: Spine nontender, no crepitus, or stepoffs.   Neurological: She is alert and oriented to person, place, and time. She has normal strength. No sensory deficit. GCS eye subscore is 4. GCS verbal subscore is 5. GCS motor subscore is 6.  CN II-XII intact, EOMs intact, no pronator drift, grip strengths equal bilaterally; strength 5/5 in all extremities, sensation intact in all extremities; heel to shin, rapid alternating movements normal.  Finger to nose abnormal bilaterally - pt is not wearing glasses and states her vision is not very good without them.    Skin: She is not diaphoretic.  Psychiatric: She has a normal mood and affect. Her behavior is normal. Thought content normal.    ED Course  Procedures (including critical care time) Labs Review Labs Reviewed  CBC WITH DIFFERENTIAL - Abnormal; Notable for the following:    RBC 3.73 (*)    Hemoglobin 11.8 (*)    Platelets 146 (*)    All other components within normal limits  COMPREHENSIVE METABOLIC PANEL - Abnormal; Notable for the following:    Total Bilirubin <0.2 (*)    GFR calc non Af Amer 83 (*)    All other components within normal limits  URINALYSIS, ROUTINE W REFLEX MICROSCOPIC    Imaging Review Dg Chest 2 View  07/25/2013   CLINICAL DATA:  Right shoulder pain  EXAM: CHEST  2 VIEW  COMPARISON:  None.  FINDINGS: The heart size is at upper limits of normal given the degree of hyperinflation. Both lungs are clear. The visualized skeletal structures are unremarkable. Hyperinflation suggests emphysema. Bilateral glenohumeral joint degenerative change noted.  IMPRESSION: No active  cardiopulmonary disease.   Electronically Signed   By: Conchita Paris M.D.   On: 07/25/2013 17:34   Dg Shoulder Right  07/25/2013   CLINICAL DATA:  [place], pain.  EXAM: RIGHT SHOULDER - 2+ VIEW  COMPARISON:  None.  FINDINGS: There is no evidence for glenohumeral dislocation or fracture. Upward subluxation of the humeral head along with a downsloping acromion suggests rotator cuff pathology. Skeletal osteopenia. Humeral shaft down to its midportion appears  intact.  IMPRESSION: Negative.   Electronically Signed   By: Rolla Flatten M.D.   On: 07/25/2013 20:21   Ct Head Wo Contrast  07/25/2013   CLINICAL DATA:  Head laceration after fall.  EXAM: CT HEAD WITHOUT CONTRAST  CT MAXILLOFACIAL WITHOUT CONTRAST  TECHNIQUE: Multidetector CT imaging of the head and maxillofacial structures were performed using the standard protocol without intravenous contrast. Multiplanar CT image reconstructions of the maxillofacial structures were also generated.  COMPARISON:  MRI scan of October 25, 2004.  FINDINGS: CT HEAD FINDINGS  Mild diffuse cortical atrophy is noted. Bony calvarium is intact. No mass effect or midline shift is noted. Ventricular size is within normal limits. There is no evidence of mass lesion, hemorrhage or acute infarction.  CT MAXILLOFACIAL FINDINGS  Moderately depressed fracture is seen involving the right zygomatic arch as well as the right lateral maxillary wall. Probable hemorrhage is noted in the right maxillary sinus. Mildly displaced fracture is also seen involving the right orbital wall. Globes and orbits appear normal. Nondisplaced fracture is seen involving the right orbital floor. Severe degenerative joint disease with hypertrophic changes seen involving the predental space. Pterygoid plates appear normal.  IMPRESSION: Moderately displaced fracture seen involving the right zygomatic arch, as well as fractures involving the lateral wall of the right maxillary sinus with associated hemorrhage. Minimally  displaced fracture is seen involving the right lateral orbital wall as well as nondisplaced fracture involving right orbital floor.  Mild diffuse cortical atrophy. No acute intracranial abnormality seen.   Electronically Signed   By: Sabino Dick M.D.   On: 07/25/2013 17:23   Dg Hand Complete Right  07/25/2013   CLINICAL DATA:  Fall.  Posterior mid hand swelling.  EXAM: RIGHT HAND - COMPLETE 3+ VIEW  COMPARISON:  None.  FINDINGS: Three views study is degraded by superimposition of fingers on the lateral film. Within this limitation, no acute fracture or frank dislocation is evident. There is loss of joint space diffusely in the IP joints with subluxation of the second and third MCP joints. Degenerative changes are noted in the carpometacarpal joint of the thumb. Deformity of the fifth metacarpal is compatible with previous fracture. Deformity is also noted in the distal radius and ulnar styloid.  IMPRESSION: Deformity noted in the distal radius may be related to previous remote trauma although there appears to be a linear lucency persisting in the articular surface. As such, distal radius fracture secondary to fall today is a consideration. If the patient has signs/symptoms referable to this region, dedicated wrist films recommended to further evaluate.   Electronically Signed   By: Misty Stanley M.D.   On: 07/25/2013 17:35   Ct Maxillofacial Wo Cm  07/25/2013   CLINICAL DATA:  Head laceration after fall.  EXAM: CT HEAD WITHOUT CONTRAST  CT MAXILLOFACIAL WITHOUT CONTRAST  TECHNIQUE: Multidetector CT imaging of the head and maxillofacial structures were performed using the standard protocol without intravenous contrast. Multiplanar CT image reconstructions of the maxillofacial structures were also generated.  COMPARISON:  MRI scan of October 25, 2004.  FINDINGS: CT HEAD FINDINGS  Mild diffuse cortical atrophy is noted. Bony calvarium is intact. No mass effect or midline shift is noted. Ventricular size is within  normal limits. There is no evidence of mass lesion, hemorrhage or acute infarction.  CT MAXILLOFACIAL FINDINGS  Moderately depressed fracture is seen involving the right zygomatic arch as well as the right lateral maxillary wall. Probable hemorrhage is noted in the right maxillary sinus. Mildly  displaced fracture is also seen involving the right orbital wall. Globes and orbits appear normal. Nondisplaced fracture is seen involving the right orbital floor. Severe degenerative joint disease with hypertrophic changes seen involving the predental space. Pterygoid plates appear normal.  IMPRESSION: Moderately displaced fracture seen involving the right zygomatic arch, as well as fractures involving the lateral wall of the right maxillary sinus with associated hemorrhage. Minimally displaced fracture is seen involving the right lateral orbital wall as well as nondisplaced fracture involving right orbital floor.  Mild diffuse cortical atrophy. No acute intracranial abnormality seen.   Electronically Signed   By: Sabino Dick M.D.   On: 07/25/2013 17:23     EKG Interpretation   Date/Time:  Monday July 25 2013 16:22:11 EDT Ventricular Rate:  53 PR Interval:  187 QRS Duration: 87 QT Interval:  416 QTC Calculation: 390 R Axis:   77 Text Interpretation:  Sinus rhythm anterior st t wave changes new since  last tracing Confirmed by KNAPP  MD-J, JON ([id number]) on 07/25/2013 4:24:52 PM      4:56 PM Discussed pt with Dr Tomi Bamberger who will also see the patient.   5:49 PM Spoke with Dr Lady Saucier who will review CT and return call.    Dr Benson Norway states pt may follow up with him in the office in the next 7-10 days, earlier if she begins having any visual symptoms.   LACERATION REPAIR Performed by: Clayton Bibles Authorized by: Clayton Bibles Consent: Verbal consent obtained. Risks and benefits: risks, benefits and alternatives were discussed Consent given by: patient Patient identity confirmed: provided demographic  data Prepped and Draped in normal sterile fashion Wound explored, though unable to determine the depth of the wound, cannot exclude open fracture.  Wound irrigated thoroughly, pt to be placed on antibiotics  Laceration Location: right face  Laceration Length: 2cm  No Foreign Bodies seen or palpated  Anesthesia: local infiltration  Local anesthetic: lidocaine 2% with epinephrine  Anesthetic total: 2 ml  Irrigation method: syringe Amount of cleaning: standard  Skin closure: 5-o ethilon  Number of sutures: 4  Technique: simple interrupted  Patient tolerance: Patient tolerated the procedure well with no immediate complications.   MDM   Final diagnoses:  Fall, initial encounter  Periorbital hematoma of right eye  Facial laceration, initial encounter  Zygomatic arch fracture, closed, initial encounter  Right maxillary fracture, open, initial encounter  Right orbit fracture, closed, initial encounter  Traumatic hematoma of right hand, initial encounter    Elderly patient with mechanical fall vs syncopal episode and fall, on plavix, with hematoma to right eye.  No other significant bony tenderness.  Not orthostatic.  Several facial fractures, discussed with Maxillofacial trauma Dr Benson Norway - will follow up 7-10 days, unless pt has eye movement or visual symptoms - discussed all of this with the patient.  Pt does have small laceration over this area, because of bleeding, attempted thorough exam of the wound but I cannot rule out open fracture and therefore put pt on antibiotics.  Abnormality of right right wrist seen on xray is likely old - pt is not tender in this area.  Pt has chronic right shoulder pain that was exacerbated by fall- xray negative.  EKG reviewed with Dr Tomi Bamberger - nonspecific changes.  Workup unremarkable.   Discussed possibility of admission with patient and she states she prefers to go home because she has to take care of her husband.  Pt was wearing shoes that are too  big for her and  likely tripped. Discussed with Dr Tomi Bamberger who agrees with plan.   Discussed result, findings, treatment, and follow up  with patient.  Pt given return precautions.  Pt verbalizes understanding and agrees with plan.        [place], PA-C 07/26/13 [phone]

## 2013-07-25 NOTE — ED Notes (Signed)
Pt ambulated to restroom with no difficulties. While in the restroom pt's nose started bleeding. Pressure held, bleeding has now stopped.

## 2013-07-25 NOTE — ED Notes (Signed)
Pt taken to Xray.

## 2013-07-25 NOTE — ED Notes (Signed)
Emily PA at bedside

## 2013-07-25 NOTE — ED Notes (Signed)
[place]. Pt tripped over her shoes, sts they are too big for her. Pt hit head, has a laceration to right eyebrown along with hematoma and light blue bruising. Pt takes plavix. BP 126/68, BP 150/76 denies loc, sts she remembers everything that happened. HR 62.

## 2013-07-25 NOTE — ED Provider Notes (Signed)
Pt was at a shopping center and appears to have stumbled and fell.  Pt does not exactly recall however why she fell.  Physical Exam  BP 123/63  Pulse 58  Temp(Src) 97.4 F (36.3 C) (Oral)  Resp 17  Ht 4\' 11"  (1.499 m)  Wt 91 lb (41.277 kg)  BMI 18.37 kg/m2  SpO2 98%  Physical Exam  Nursing note and vitals reviewed. Constitutional: She appears well-developed and well-nourished. No distress.  HENT:  Head: Normocephalic.  Right Ear: External ear normal.  Left Ear: External ear normal.  right sided hematoma and ttp overlying zygomatic arch region  Eyes: Conjunctivae are normal. Right eye exhibits no discharge. Left eye exhibits no discharge. No scleral icterus.  Neck: Neck supple. No tracheal deviation present.  Cardiovascular: Normal rate.   Pulmonary/Chest: Effort normal. No stridor. No respiratory distress.  Musculoskeletal: She exhibits no edema.  Neurological: She is alert. Cranial nerve deficit: no gross deficits.  Skin: Skin is warm and dry. No rash noted.  Psychiatric: She has a normal mood and affect.    EKG Interpretation  Date/Time:  Monday July 25 2013 16:22:11 EDT Ventricular Rate:  53 PR Interval:  187 QRS Duration: 87 QT Interval:  416 QTC Calculation: 390 R Axis:   77 Text Interpretation:  Sinus rhythm anterior st t wave changes new since last tracing Confirmed by Cena Bruhn  MD-J, Ajani Schnieders ([id number]) on 07/25/2013 4:24:52 PM       ED Course  Procedures  MDM Likely mechanical fall although there is some uncertainty by the history.  Pt has been up and walking here in the ED.  No ataxia.  Reviewed ct findings with patient.  Will consult with facial surgery although anticipate outpatient follow up.      Dorie Rank, MD 07/25/13 1750

## 2013-07-26 NOTE — ED Provider Notes (Signed)
Medical screening examination/treatment/procedure(s) were performed by non-physician practitioner and as supervising physician I was immediately available for consultation/collaboration.   EKG Interpretation   Date/Time:  Monday July 25 2013 16:22:11 EDT Ventricular Rate:  53 PR Interval:  187 QRS Duration: 87 QT Interval:  416 QTC Calculation: 390 R Axis:   77 Text Interpretation:  Sinus rhythm anterior st t wave changes new since  last tracing Confirmed by Vencil Basnett  MD-J, Roxy Filler ([id number]) on 07/25/2013 4:24:52 PM       Dorie Rank, MD 07/26/13 2297

## 2015-01-08 ENCOUNTER — Encounter (INDEPENDENT_AMBULATORY_CARE_PROVIDER_SITE_OTHER): Payer: Self-pay | Admitting: Internal Medicine

## 2015-01-08 ENCOUNTER — Ambulatory Visit (INDEPENDENT_AMBULATORY_CARE_PROVIDER_SITE_OTHER): Payer: Medicare Other | Admitting: Internal Medicine

## 2015-01-08 VITALS — BP 100/68 | HR 66 | Temp 97.3°F | Resp 18 | Ht 60.0 in | Wt 91.7 lb

## 2015-01-08 DIAGNOSIS — F430 Acute stress reaction: Secondary | ICD-10-CM | POA: Insufficient documentation

## 2015-01-08 DIAGNOSIS — R634 Abnormal weight loss: Secondary | ICD-10-CM | POA: Diagnosis not present

## 2015-01-08 DIAGNOSIS — Z86010 Personal history of colonic polyps: Secondary | ICD-10-CM | POA: Diagnosis not present

## 2015-01-08 DIAGNOSIS — Z85038 Personal history of other malignant neoplasm of large intestine: Secondary | ICD-10-CM | POA: Insufficient documentation

## 2015-01-08 DIAGNOSIS — R531 Weakness: Secondary | ICD-10-CM | POA: Diagnosis not present

## 2015-01-08 DIAGNOSIS — I2510 Atherosclerotic heart disease of native coronary artery without angina pectoris: Secondary | ICD-10-CM | POA: Insufficient documentation

## 2015-01-08 LAB — CBC
HCT: 37 % (ref 36.0–46.0)
Hemoglobin: 12.2 g/dL (ref 12.0–15.0)
MCH: 31.9 pg (ref 26.0–34.0)
MCHC: 33 g/dL (ref 30.0–36.0)
MCV: 96.6 fL (ref 78.0–100.0)
MPV: 10.9 fL (ref 8.6–12.4)
PLATELETS: 172 10*3/uL (ref 150–400)
RBC: 3.83 MIL/uL — AB (ref 3.87–5.11)
RDW: 13.6 % (ref 11.5–15.5)
WBC: 3.7 10*3/uL — ABNORMAL LOW (ref 4.0–10.5)

## 2015-01-08 NOTE — Patient Instructions (Signed)
Try to eat snacks between meals. Physician will call with results of blood tests when completed. Weight check in one month

## 2015-01-08 NOTE — Progress Notes (Signed)
Presenting complaint;  Weight loss and weakness.  Subjective:  Patient is 81 year old Caucasian female who called few days ago and requested to be seen. She has history of colon carcinoma(sigmoid colon resection in 1986 at [facility]) and history of colonic polyps with last colonoscopy in October 2014. Patient says she has been losing weight. She states she has lost over 10 pounds this year. She states she has good appetite. She eats 3 meals a day. She denies nausea vomiting heartburn dysphagia melena or rectal bleeding. He has intermittent pain at left upper quadrant of her abdomen. She has chronic constipation and she takes Colace which helps. She denies fever chills or night sweats. She states she is always on the go trying to help her husband who has dementia he does not do much since he had hip fracture 2 weeks ago. Her stepson helps when he can. She also complains of rash over both elbows. She was given prednisone by Dr. Woody Seller but it has not helped. She has not gone back for follow-up visit. She complains of feeling very weak. She has not had shortness of breath.. She fell in July while she was at [employer] store in [place] and was seen in emergency room at [facility]. She was treated and discharged. She had cardiac cath in May 2016 at [facility] and LV function was 65%. She had 100% occlusion to RCA with collaterals. This result was reviewed in epic. She is on low-dose citalopram for nerves. She believes it is helping her. He feels all of the stress is related to her husband's illness.   Current Medications: Outpatient Encounter Prescriptions as of 01/08/2015  Medication Sig  . citalopram (CELEXA) 10 MG tablet Take 10 mg by mouth daily.  . clopidogrel (PLAVIX) 75 MG tablet Take 75 mg by mouth daily.  . cycloSPORINE (RESTASIS) 0.05 % ophthalmic emulsion Place 1 drop into both eyes daily.  Marland Kitchen docusate sodium (COLACE) 100 MG capsule Take 200 mg by mouth at bedtime.  . isosorbide mononitrate (IMDUR) 30 MG 24 hr  tablet Take 30 mg by mouth daily.  . Liniments (SALONPAS) PADS Apply 1 each topically daily as needed (pain).  . Multiple Vitamin (MULTIVITAMIN) tablet Take 1 tablet by mouth daily.  . [DISCONTINUED] cephALEXin (KEFLEX) 500 MG capsule Take 1 capsule (500 mg total) by mouth 4 (four) times daily.   No facility-administered encounter medications on file as of 01/08/2015.   Past Medical History  Diagnosis Date  . Coronary artery disease     3 stents  . Anginal pain ([place])   . Depression   . GERD (gastroesophageal reflux disease)   . Headache(784.0)   . Cancer ([place])     colon  . Arthritis   . Anemia     hx of transfusion   Past Surgical History  Procedure Laterality Date  . Colon surgery    . Eye surgery    . Cardiac catheterization      3 stents  . Abdominal hysterectomy    . Back surgery      x 2  . Fracture surgery      right wrist  . Appendectomy    . Colonoscopy N/A 10/27/2012    Procedure: COLONOSCOPY;  Surgeon: Rogene Houston, MD;  Location: [facility];  Service: Endoscopy;  Laterality: N/A;  1030-rescheduled to 8:30am Ann to notify pt      Objective: Blood pressure 100/68, pulse 66, temperature 97.3 F (36.3 C), temperature source Oral, resp. rate 18, height 5' (1.524 m),  weight 91 lb 11.2 oz (41.595 kg). Patient is alert and in no acute distress. Conjunctiva is pink. Sclera is nonicteric Oropharyngeal mucosa is normal. He has her own teeth in lower jaw and upper dentures. No neck masses or thyromegaly noted. Cardiac exam with regular rhythm normal S1 and S2. No murmur or gallop noted. Lungs are clear to auscultation. Abdomen is flat and soft without tenderness organomegaly or masses. No LE edema or clubbing noted. She has erythematous papular rash over both elbows.  Labs/studies Results: H&H was 11.8 and 36.4. on 07/25/2013    Assessment:  #1. Weight loss. Weight loss does not appear to be due to anorexia or diminished oral intake unless her  information is inaccurate. Doubt that her weight loss is secondary to GI issues. Colon cancer surgery was 30 years ago and she has remained in remission. #2. Weakness. Will check for thyroid disease as well as adrenocortical insufficiency and will rule out anemia and hypokalemia. Cardiac cath in May 2016 revealed normal ejection fraction therefore weakness does not appear to be of cardiac origin. #3. History of colonic polyps. Last colonoscopy was in October 2014. #4. Remote history of colon carcinoma and she remains in remission.   Plan:  Patient advised to eats snacks between meals. She will follow-up with Dr. Woody Seller regarding skin rash. Patient will go to the lab for CBC, comprehensive chemistry panel, sedimentation rate, TSH and random cortisol level. Weight check in one month. Office visit in 3 months.

## 2015-01-09 LAB — COMPREHENSIVE METABOLIC PANEL
ALK PHOS: 47 U/L (ref 33–130)
ALT: 14 U/L (ref 6–29)
AST: 18 U/L (ref 10–35)
Albumin: 3.9 g/dL (ref 3.6–5.1)
BILIRUBIN TOTAL: 0.5 mg/dL (ref 0.2–1.2)
BUN: 14 mg/dL (ref 7–25)
CO2: 31 mmol/L (ref 20–31)
CREATININE: 0.57 mg/dL — AB (ref 0.60–0.88)
Calcium: 9.1 mg/dL (ref 8.6–10.4)
Chloride: 106 mmol/L (ref 98–110)
Glucose, Bld: 66 mg/dL (ref 65–99)
Potassium: 4.6 mmol/L (ref 3.5–5.3)
SODIUM: 140 mmol/L (ref 135–146)
TOTAL PROTEIN: 6.1 g/dL (ref 6.1–8.1)

## 2015-01-09 LAB — CORTISOL: Cortisol, Plasma: 9.1 ug/dL

## 2015-01-09 LAB — SEDIMENTATION RATE: Sed Rate: 4 mm/hr (ref 0–30)

## 2015-01-09 LAB — TSH: TSH: 3.383 u[IU]/mL (ref 0.350–4.500)

## 2015-01-10 ENCOUNTER — Ambulatory Visit (INDEPENDENT_AMBULATORY_CARE_PROVIDER_SITE_OTHER): Payer: Medicare Other | Admitting: Internal Medicine

## 2015-01-10 ENCOUNTER — Encounter (INDEPENDENT_AMBULATORY_CARE_PROVIDER_SITE_OTHER): Payer: Self-pay | Admitting: *Deleted

## 2015-01-10 ENCOUNTER — Telehealth (INDEPENDENT_AMBULATORY_CARE_PROVIDER_SITE_OTHER): Payer: Self-pay | Admitting: *Deleted

## 2015-01-10 DIAGNOSIS — R634 Abnormal weight loss: Secondary | ICD-10-CM

## 2015-01-10 DIAGNOSIS — R531 Weakness: Secondary | ICD-10-CM

## 2015-01-10 NOTE — Telephone Encounter (Signed)
Per Dr.Rehman the patient will need to have labs drawn in 1 months when she comes in for a weight check.

## 2015-02-09 DIAGNOSIS — M6250 Muscle wasting and atrophy, not elsewhere classified, unspecified site: Secondary | ICD-10-CM | POA: Diagnosis not present

## 2015-02-09 DIAGNOSIS — M7550 Bursitis of unspecified shoulder: Secondary | ICD-10-CM | POA: Diagnosis not present

## 2015-02-09 DIAGNOSIS — R296 Repeated falls: Secondary | ICD-10-CM | POA: Diagnosis not present

## 2015-02-12 DIAGNOSIS — R296 Repeated falls: Secondary | ICD-10-CM | POA: Diagnosis not present

## 2015-02-12 DIAGNOSIS — M7550 Bursitis of unspecified shoulder: Secondary | ICD-10-CM | POA: Diagnosis not present

## 2015-02-12 DIAGNOSIS — M6250 Muscle wasting and atrophy, not elsewhere classified, unspecified site: Secondary | ICD-10-CM | POA: Diagnosis not present

## 2015-02-13 DIAGNOSIS — R296 Repeated falls: Secondary | ICD-10-CM | POA: Diagnosis not present

## 2015-02-13 DIAGNOSIS — M6250 Muscle wasting and atrophy, not elsewhere classified, unspecified site: Secondary | ICD-10-CM | POA: Diagnosis not present

## 2015-02-13 DIAGNOSIS — M7550 Bursitis of unspecified shoulder: Secondary | ICD-10-CM | POA: Diagnosis not present

## 2015-02-14 DIAGNOSIS — R296 Repeated falls: Secondary | ICD-10-CM | POA: Diagnosis not present

## 2015-02-14 DIAGNOSIS — M7550 Bursitis of unspecified shoulder: Secondary | ICD-10-CM | POA: Diagnosis not present

## 2015-02-14 DIAGNOSIS — M6250 Muscle wasting and atrophy, not elsewhere classified, unspecified site: Secondary | ICD-10-CM | POA: Diagnosis not present

## 2015-02-15 DIAGNOSIS — M6250 Muscle wasting and atrophy, not elsewhere classified, unspecified site: Secondary | ICD-10-CM | POA: Diagnosis not present

## 2015-02-15 DIAGNOSIS — M7550 Bursitis of unspecified shoulder: Secondary | ICD-10-CM | POA: Diagnosis not present

## 2015-02-15 DIAGNOSIS — R296 Repeated falls: Secondary | ICD-10-CM | POA: Diagnosis not present

## 2015-02-19 DIAGNOSIS — M7550 Bursitis of unspecified shoulder: Secondary | ICD-10-CM | POA: Diagnosis not present

## 2015-02-19 DIAGNOSIS — R296 Repeated falls: Secondary | ICD-10-CM | POA: Diagnosis not present

## 2015-02-19 DIAGNOSIS — M6250 Muscle wasting and atrophy, not elsewhere classified, unspecified site: Secondary | ICD-10-CM | POA: Diagnosis not present

## 2015-02-20 DIAGNOSIS — M7550 Bursitis of unspecified shoulder: Secondary | ICD-10-CM | POA: Diagnosis not present

## 2015-02-20 DIAGNOSIS — M6250 Muscle wasting and atrophy, not elsewhere classified, unspecified site: Secondary | ICD-10-CM | POA: Diagnosis not present

## 2015-02-20 DIAGNOSIS — R296 Repeated falls: Secondary | ICD-10-CM | POA: Diagnosis not present

## 2015-02-21 DIAGNOSIS — M7550 Bursitis of unspecified shoulder: Secondary | ICD-10-CM | POA: Diagnosis not present

## 2015-02-21 DIAGNOSIS — M6250 Muscle wasting and atrophy, not elsewhere classified, unspecified site: Secondary | ICD-10-CM | POA: Diagnosis not present

## 2015-02-21 DIAGNOSIS — R296 Repeated falls: Secondary | ICD-10-CM | POA: Diagnosis not present

## 2015-02-22 DIAGNOSIS — M6250 Muscle wasting and atrophy, not elsewhere classified, unspecified site: Secondary | ICD-10-CM | POA: Diagnosis not present

## 2015-02-22 DIAGNOSIS — R296 Repeated falls: Secondary | ICD-10-CM | POA: Diagnosis not present

## 2015-02-22 DIAGNOSIS — M7550 Bursitis of unspecified shoulder: Secondary | ICD-10-CM | POA: Diagnosis not present

## 2015-02-23 DIAGNOSIS — H353132 Nonexudative age-related macular degeneration, bilateral, intermediate dry stage: Secondary | ICD-10-CM | POA: Diagnosis not present

## 2015-02-23 DIAGNOSIS — H5022 Vertical strabismus, left eye: Secondary | ICD-10-CM | POA: Diagnosis not present

## 2015-02-23 DIAGNOSIS — E0500 Thyrotoxicosis with diffuse goiter without thyrotoxic crisis or storm: Secondary | ICD-10-CM | POA: Diagnosis not present

## 2015-02-23 DIAGNOSIS — H35363 Drusen (degenerative) of macula, bilateral: Secondary | ICD-10-CM | POA: Diagnosis not present

## 2015-02-23 DIAGNOSIS — H5340 Unspecified visual field defects: Secondary | ICD-10-CM | POA: Diagnosis not present

## 2015-02-23 DIAGNOSIS — H53453 Other localized visual field defect, bilateral: Secondary | ICD-10-CM | POA: Diagnosis not present

## 2015-02-26 DIAGNOSIS — R296 Repeated falls: Secondary | ICD-10-CM | POA: Diagnosis not present

## 2015-02-26 DIAGNOSIS — M6250 Muscle wasting and atrophy, not elsewhere classified, unspecified site: Secondary | ICD-10-CM | POA: Diagnosis not present

## 2015-02-26 DIAGNOSIS — M7550 Bursitis of unspecified shoulder: Secondary | ICD-10-CM | POA: Diagnosis not present

## 2015-02-27 DIAGNOSIS — R296 Repeated falls: Secondary | ICD-10-CM | POA: Diagnosis not present

## 2015-02-27 DIAGNOSIS — M6250 Muscle wasting and atrophy, not elsewhere classified, unspecified site: Secondary | ICD-10-CM | POA: Diagnosis not present

## 2015-02-27 DIAGNOSIS — M7550 Bursitis of unspecified shoulder: Secondary | ICD-10-CM | POA: Diagnosis not present

## 2015-02-28 DIAGNOSIS — M6250 Muscle wasting and atrophy, not elsewhere classified, unspecified site: Secondary | ICD-10-CM | POA: Diagnosis not present

## 2015-02-28 DIAGNOSIS — R296 Repeated falls: Secondary | ICD-10-CM | POA: Diagnosis not present

## 2015-02-28 DIAGNOSIS — M7550 Bursitis of unspecified shoulder: Secondary | ICD-10-CM | POA: Diagnosis not present

## 2015-03-01 DIAGNOSIS — M7550 Bursitis of unspecified shoulder: Secondary | ICD-10-CM | POA: Diagnosis not present

## 2015-03-01 DIAGNOSIS — M6250 Muscle wasting and atrophy, not elsewhere classified, unspecified site: Secondary | ICD-10-CM | POA: Diagnosis not present

## 2015-03-01 DIAGNOSIS — R296 Repeated falls: Secondary | ICD-10-CM | POA: Diagnosis not present

## 2015-03-05 DIAGNOSIS — M7550 Bursitis of unspecified shoulder: Secondary | ICD-10-CM | POA: Diagnosis not present

## 2015-03-05 DIAGNOSIS — M6250 Muscle wasting and atrophy, not elsewhere classified, unspecified site: Secondary | ICD-10-CM | POA: Diagnosis not present

## 2015-03-05 DIAGNOSIS — C44722 Squamous cell carcinoma of skin of right lower limb, including hip: Secondary | ICD-10-CM | POA: Diagnosis not present

## 2015-03-05 DIAGNOSIS — R296 Repeated falls: Secondary | ICD-10-CM | POA: Diagnosis not present

## 2015-03-06 DIAGNOSIS — M6250 Muscle wasting and atrophy, not elsewhere classified, unspecified site: Secondary | ICD-10-CM | POA: Diagnosis not present

## 2015-03-06 DIAGNOSIS — M7550 Bursitis of unspecified shoulder: Secondary | ICD-10-CM | POA: Diagnosis not present

## 2015-03-06 DIAGNOSIS — R296 Repeated falls: Secondary | ICD-10-CM | POA: Diagnosis not present

## 2015-03-07 DIAGNOSIS — M6250 Muscle wasting and atrophy, not elsewhere classified, unspecified site: Secondary | ICD-10-CM | POA: Diagnosis not present

## 2015-03-07 DIAGNOSIS — R296 Repeated falls: Secondary | ICD-10-CM | POA: Diagnosis not present

## 2015-03-07 DIAGNOSIS — M7550 Bursitis of unspecified shoulder: Secondary | ICD-10-CM | POA: Diagnosis not present

## 2015-03-08 DIAGNOSIS — R296 Repeated falls: Secondary | ICD-10-CM | POA: Diagnosis not present

## 2015-03-08 DIAGNOSIS — M7550 Bursitis of unspecified shoulder: Secondary | ICD-10-CM | POA: Diagnosis not present

## 2015-03-08 DIAGNOSIS — M6250 Muscle wasting and atrophy, not elsewhere classified, unspecified site: Secondary | ICD-10-CM | POA: Diagnosis not present

## 2015-03-12 DIAGNOSIS — M6250 Muscle wasting and atrophy, not elsewhere classified, unspecified site: Secondary | ICD-10-CM | POA: Diagnosis not present

## 2015-03-12 DIAGNOSIS — R296 Repeated falls: Secondary | ICD-10-CM | POA: Diagnosis not present

## 2015-03-12 DIAGNOSIS — M7550 Bursitis of unspecified shoulder: Secondary | ICD-10-CM | POA: Diagnosis not present

## 2015-03-13 DIAGNOSIS — R296 Repeated falls: Secondary | ICD-10-CM | POA: Diagnosis not present

## 2015-03-13 DIAGNOSIS — M7550 Bursitis of unspecified shoulder: Secondary | ICD-10-CM | POA: Diagnosis not present

## 2015-03-13 DIAGNOSIS — M6250 Muscle wasting and atrophy, not elsewhere classified, unspecified site: Secondary | ICD-10-CM | POA: Diagnosis not present

## 2015-03-14 DIAGNOSIS — M6250 Muscle wasting and atrophy, not elsewhere classified, unspecified site: Secondary | ICD-10-CM | POA: Diagnosis not present

## 2015-03-14 DIAGNOSIS — M7550 Bursitis of unspecified shoulder: Secondary | ICD-10-CM | POA: Diagnosis not present

## 2015-03-14 DIAGNOSIS — R296 Repeated falls: Secondary | ICD-10-CM | POA: Diagnosis not present

## 2015-03-15 DIAGNOSIS — M7550 Bursitis of unspecified shoulder: Secondary | ICD-10-CM | POA: Diagnosis not present

## 2015-03-15 DIAGNOSIS — R296 Repeated falls: Secondary | ICD-10-CM | POA: Diagnosis not present

## 2015-03-15 DIAGNOSIS — M6250 Muscle wasting and atrophy, not elsewhere classified, unspecified site: Secondary | ICD-10-CM | POA: Diagnosis not present

## 2015-03-19 DIAGNOSIS — M6250 Muscle wasting and atrophy, not elsewhere classified, unspecified site: Secondary | ICD-10-CM | POA: Diagnosis not present

## 2015-03-19 DIAGNOSIS — M7550 Bursitis of unspecified shoulder: Secondary | ICD-10-CM | POA: Diagnosis not present

## 2015-03-19 DIAGNOSIS — R296 Repeated falls: Secondary | ICD-10-CM | POA: Diagnosis not present

## 2015-03-20 DIAGNOSIS — M7550 Bursitis of unspecified shoulder: Secondary | ICD-10-CM | POA: Diagnosis not present

## 2015-03-20 DIAGNOSIS — R296 Repeated falls: Secondary | ICD-10-CM | POA: Diagnosis not present

## 2015-03-20 DIAGNOSIS — M6250 Muscle wasting and atrophy, not elsewhere classified, unspecified site: Secondary | ICD-10-CM | POA: Diagnosis not present

## 2015-03-21 DIAGNOSIS — R296 Repeated falls: Secondary | ICD-10-CM | POA: Diagnosis not present

## 2015-03-21 DIAGNOSIS — M7550 Bursitis of unspecified shoulder: Secondary | ICD-10-CM | POA: Diagnosis not present

## 2015-03-21 DIAGNOSIS — M6250 Muscle wasting and atrophy, not elsewhere classified, unspecified site: Secondary | ICD-10-CM | POA: Diagnosis not present

## 2015-03-26 DIAGNOSIS — H35051 Retinal neovascularization, unspecified, right eye: Secondary | ICD-10-CM | POA: Diagnosis not present

## 2015-03-28 DIAGNOSIS — Z681 Body mass index (BMI) 19 or less, adult: Secondary | ICD-10-CM | POA: Diagnosis not present

## 2015-03-28 DIAGNOSIS — R21 Rash and other nonspecific skin eruption: Secondary | ICD-10-CM | POA: Diagnosis not present

## 2015-03-28 DIAGNOSIS — I10 Essential (primary) hypertension: Secondary | ICD-10-CM | POA: Diagnosis not present

## 2015-03-28 DIAGNOSIS — M19011 Primary osteoarthritis, right shoulder: Secondary | ICD-10-CM | POA: Diagnosis not present

## 2015-03-28 DIAGNOSIS — Z789 Other specified health status: Secondary | ICD-10-CM | POA: Diagnosis not present

## 2015-04-09 DIAGNOSIS — D485 Neoplasm of uncertain behavior of skin: Secondary | ICD-10-CM | POA: Diagnosis not present

## 2015-04-09 DIAGNOSIS — L309 Dermatitis, unspecified: Secondary | ICD-10-CM | POA: Diagnosis not present

## 2015-04-09 DIAGNOSIS — L280 Lichen simplex chronicus: Secondary | ICD-10-CM | POA: Diagnosis not present

## 2015-04-09 DIAGNOSIS — Z85828 Personal history of other malignant neoplasm of skin: Secondary | ICD-10-CM | POA: Diagnosis not present

## 2015-04-09 DIAGNOSIS — L570 Actinic keratosis: Secondary | ICD-10-CM | POA: Diagnosis not present

## 2015-04-10 ENCOUNTER — Ambulatory Visit (INDEPENDENT_AMBULATORY_CARE_PROVIDER_SITE_OTHER): Payer: Medicare Other | Admitting: Internal Medicine

## 2015-04-11 DIAGNOSIS — I2510 Atherosclerotic heart disease of native coronary artery without angina pectoris: Secondary | ICD-10-CM | POA: Diagnosis not present

## 2015-04-11 DIAGNOSIS — I10 Essential (primary) hypertension: Secondary | ICD-10-CM | POA: Diagnosis not present

## 2015-04-12 ENCOUNTER — Encounter (INDEPENDENT_AMBULATORY_CARE_PROVIDER_SITE_OTHER): Payer: Self-pay | Admitting: Internal Medicine

## 2015-04-24 DIAGNOSIS — L280 Lichen simplex chronicus: Secondary | ICD-10-CM | POA: Diagnosis not present

## 2015-05-14 DIAGNOSIS — I2510 Atherosclerotic heart disease of native coronary artery without angina pectoris: Secondary | ICD-10-CM | POA: Diagnosis not present

## 2015-05-14 DIAGNOSIS — I10 Essential (primary) hypertension: Secondary | ICD-10-CM | POA: Diagnosis not present

## 2015-05-25 DIAGNOSIS — I2510 Atherosclerotic heart disease of native coronary artery without angina pectoris: Secondary | ICD-10-CM | POA: Diagnosis not present

## 2015-05-25 DIAGNOSIS — I10 Essential (primary) hypertension: Secondary | ICD-10-CM | POA: Diagnosis not present

## 2015-06-19 ENCOUNTER — Encounter: Payer: Self-pay | Admitting: Cardiology

## 2015-06-19 ENCOUNTER — Ambulatory Visit (INDEPENDENT_AMBULATORY_CARE_PROVIDER_SITE_OTHER): Payer: Medicare Other | Admitting: Cardiology

## 2015-06-19 VITALS — BP 114/52 | HR 59 | Ht 59.0 in | Wt 91.0 lb

## 2015-06-19 DIAGNOSIS — I2510 Atherosclerotic heart disease of native coronary artery without angina pectoris: Secondary | ICD-10-CM | POA: Diagnosis not present

## 2015-06-19 MED ORDER — ISOSORBIDE MONONITRATE ER 60 MG PO TB24: 60.0000 mg | ORAL_TABLET | Freq: Every day | ORAL | Status: DC

## 2015-06-19 NOTE — Patient Instructions (Signed)
Medication Instructions:  INCREASE Isosorbide 60 mg daily  Labwork: NONE  Testing/Procedures: NONE  Follow-Up: Your physician recommends that you schedule a follow-up appointment in: 1 Month in [place]   Any Other Special Instructions Will Be Listed Below (If Applicable).   If you need a refill on your cardiac medications before your next appointment, please call your pharmacy.

## 2015-06-19 NOTE — Progress Notes (Signed)
Cardiology Office Note   Date:  06/19/2015   ID:  Alexandria Wilson, DOB 1928/08/09, MRN [id number]  PCP:  Glenda Chroman, MD  Cardiologist:   Minus Breeding, MD   Chief Complaint  Patient presents with  . Chest Pain      History of Present Illness: Alexandria Wilson is a 81 y.o. female who presents for follow up of CAD.  She has a history apparently of 3 stents.  She had her care in [place] and more recently at [facility].  She is new to me and has not been in our practice since 2008.  I was able to find (through [place]) records from her last cath in 2016.  She was having chest pain at that time and there was noted to be an occluded chronic RCA.  A first diagonal had 30 % stenosis.  She was managed medically.  Her EF was well preserved.  She is re establishing care.    She does report chest discomfort. This happens when she is anxious (and her granddaughter says she is anxious to much) or when she ambulates to the mailbox. She says it can be 4 out of 10 in intensity. It is a mid chest discomfort.  It does not radiate. She does have some mild shortness of breath with this but does not describe diaphoresis. She might get some palpitations occasionally. She did have distant syncope unrelated but doesn't usually have this problem. She says her discomfort typically goes away with one nitroglycerin. The pain is not similar to her reflux. It is the same pain for which she was cathed in 2016 although she thinks it might be slightly more intense. She denies any discomfort when she is resting or not emotionally stressed. She is somewhat limited in her activities because of her age, caring for her demented husband and some history of falls.  Past Medical History  Diagnosis Date  . Coronary artery disease     3 stents  . Anginal pain ([place])   . Depression   . GERD (gastroesophageal reflux disease)   . Headache(784.0)   . Cancer ([place])     colon  . Arthritis   . Anemia     hx of transfusion     Past Surgical History  Procedure Laterality Date  . Colon surgery    . Eye surgery    . Cardiac catheterization      3 stents  . Abdominal hysterectomy    . Back surgery      x 2  . Fracture surgery      right wrist  . Appendectomy    . Colonoscopy N/A 10/27/2012    Procedure: COLONOSCOPY;  Surgeon: Rogene Houston, MD;  [facility];  Service: Endoscopy;  Laterality: N/A;  1030-rescheduled to 8:30am Ann to notify pt     Current Outpatient Prescriptions  Medication Sig Dispense Refill  . citalopram (CELEXA) 10 MG tablet Take 10 mg by mouth daily.    . clopidogrel (PLAVIX) 75 MG tablet Take 75 mg by mouth daily.    . cycloSPORINE (RESTASIS) 0.05 % ophthalmic emulsion Place 1 drop into both eyes daily.    Marland Kitchen docusate sodium (COLACE) 100 MG capsule Take 200 mg by mouth at bedtime.    . Liniments (SALONPAS) PADS Apply 1 each topically daily as needed (pain).    . Multiple Vitamin (MULTIVITAMIN) tablet Take 1 tablet by mouth daily.    . isosorbide mononitrate (IMDUR) 60 MG  24 hr tablet Take 1 tablet (60 mg total) by mouth daily. 90 tablet 3   No current facility-administered medications for this visit.    Allergies:   Review of patient's allergies indicates no known allergies.    Social History:  The patient  reports that she quit smoking about 27 years ago. She has never used smokeless tobacco. She reports that she does not drink alcohol or use illicit drugs.   Family History:  The patient's family history includes Alzheimer's disease in her mother; CAD in her father; COPD in her brother; Congestive Heart Failure in her brother.    ROS:  Please see the history of present illness.   Otherwise, review of systems are positive for none.   All other systems are reviewed and negative.    PHYSICAL EXAM: VS:  BP 114/52 mmHg  Pulse 59  Ht 4\' 11"  (1.499 m)  Wt 91 lb (41.277 kg)  BMI 18.37 kg/m2 , BMI Body mass index is 18.37 kg/(m^2). GENERAL:  Well appearing HEENT:   Pupils equal round and reactive, fundi not visualized, oral mucosa unremarkable NECK:  No jugular venous distention, waveform within normal limits, carotid upstroke brisk and symmetric, no bruits, no thyromegaly LYMPHATICS:  No cervical, inguinal adenopathy LUNGS:  Clear to auscultation bilaterally BACK:  No CVA tenderness CHEST:  Unremarkable HEART:  PMI not displaced or sustained,S1 and S2 within normal limits, no S3, no S4, no clicks, no rubs, no murmurs ABD:  Flat, positive bowel sounds normal in frequency in pitch, no bruits, no rebound, no guarding, no midline pulsatile mass, no hepatomegaly, no splenomegaly EXT:  2 plus pulses throughout, no edema, no cyanosis no clubbing SKIN:  No rashes no nodules NEURO:  Cranial nerves II through XII grossly intact, motor grossly intact throughout PSYCH:  Cognitively intact, oriented to person place and time    EKG:  EKG is ordered today. The ekg ordered today demonstrates sinus rhythm, rate 59, atrial arrhythmia, axis within normal limits, intervals within normal limits, no acute ST-T wave changes.   Recent Labs: 01/08/2015: ALT 14; BUN 14; Creat 0.57*; Hemoglobin 12.2; Platelets 172; Potassium 4.6; Sodium 140; TSH 3.383    Lipid Panel No results found for: CHOL, TRIG, HDL, CHOLHDL, VLDL, LDLCALC, LDLDIRECT    Wt Readings from Last 3 Encounters:  06/19/15 91 lb (41.277 kg)  01/08/15 91 lb 11.2 oz (41.595 kg)  07/25/13 91 lb (41.277 kg)      Other studies Reviewed: Additional studies/ records that were reviewed today include: Care Everywhere. Review of the above records demonstrates:  Please see elsewhere in the note.     ASSESSMENT AND PLAN:  CAD:  The patient has known obstructive coronary disease as described. I suspect her symptoms are related to the chronic occlusion. At this point I will suggest continued medical management and increase her Imdur to 60 mg daily. Her meds can be further titrated based on symptoms. I think  Plavix is otherwise a good choice.  RISK REDUCTION:  She is not on a statin. Unless there is a contraindication I would suggest this.  I would like to see if there is a lipid profile recently to further understand how far from target she might be.  I will try to obtain these records.    Current medicines are reviewed at length with the patient today.  The patient does not have concerns regarding medicines.  The following changes have been made:  See above  Labs/ tests ordered today include:  Orders Placed This Encounter  Procedures  . EKG 12-Lead     Disposition:   FU with an MD in [place].  (I suggested this since she lives there and has to get transportation to [place].)    Signed, Minus Breeding, MD  06/19/2015 5:26 PM    [place]

## 2015-06-28 DIAGNOSIS — I2510 Atherosclerotic heart disease of native coronary artery without angina pectoris: Secondary | ICD-10-CM | POA: Diagnosis not present

## 2015-06-28 DIAGNOSIS — I10 Essential (primary) hypertension: Secondary | ICD-10-CM | POA: Diagnosis not present

## 2015-07-03 ENCOUNTER — Telehealth: Payer: Self-pay | Admitting: *Deleted

## 2015-07-03 ENCOUNTER — Telehealth: Payer: Self-pay | Admitting: Cardiology

## 2015-07-03 DIAGNOSIS — E785 Hyperlipidemia, unspecified: Secondary | ICD-10-CM

## 2015-07-03 DIAGNOSIS — Z79899 Other long term (current) drug therapy: Secondary | ICD-10-CM

## 2015-07-03 NOTE — Telephone Encounter (Signed)
Spoke with pt letting her know Dr Percival Spanish need her to have some blood work done, pt voice understanding.   Fasting Lipids liver was ordered

## 2015-07-03 NOTE — Telephone Encounter (Signed)
Returned call to patient/spoke with granddaughter - patient is on speakerphone. Patient received call about needing lab work done. They would prefer for patient to have labs either at [facility] or at PCP office. Informed that I do not have a way to order labs to be done at [facility] - they asked if labs can be faxed to PCP office for her to get blood work. Informed her I can do this. Lab orders printed and faxed to Dr. Woody Seller' office @ [phone]

## 2015-07-03 NOTE — Telephone Encounter (Signed)
New Message  Pt requested to speak w/ RN about having lab work done in [place] and which labs she needs to do. Please call back and discuss.

## 2015-07-03 NOTE — Telephone Encounter (Signed)
-----   Message from Minus Breeding, MD sent at 07/02/2015  5:07 PM EDT ----- She needs to have a fasting lipid profile.  ----- Message -----    From: Alexandria Wilson    Sent: 06/20/2015   1:42 PM      To: Minus Breeding, MD  No recent lab drawn from PCP, last one was on 01/08/15 from Dr Corbin Ade, it's in [place].  ----- Message -----    From: Minus Breeding, MD    Sent: 06/19/2015   5:28 PM      To: Alexandria Wilson  Can we try to get a lipid profile from her PCP if one has been drawn?  Thanks.

## 2015-07-06 DIAGNOSIS — E785 Hyperlipidemia, unspecified: Secondary | ICD-10-CM | POA: Diagnosis not present

## 2015-07-06 DIAGNOSIS — Z5181 Encounter for therapeutic drug level monitoring: Secondary | ICD-10-CM | POA: Diagnosis not present

## 2015-07-06 DIAGNOSIS — Z79899 Other long term (current) drug therapy: Secondary | ICD-10-CM | POA: Diagnosis not present

## 2015-07-12 ENCOUNTER — Telehealth: Payer: Self-pay | Admitting: *Deleted

## 2015-07-12 DIAGNOSIS — Z79899 Other long term (current) drug therapy: Secondary | ICD-10-CM

## 2015-07-12 DIAGNOSIS — E785 Hyperlipidemia, unspecified: Secondary | ICD-10-CM

## 2015-07-12 MED ORDER — ATORVASTATIN CALCIUM 80 MG PO TABS: 80.0000 mg | ORAL_TABLET | Freq: Every day | ORAL | Status: DC

## 2015-07-12 NOTE — Telephone Encounter (Signed)
-----   Message from Minus Breeding, MD sent at 07/10/2015  9:07 PM EDT ----- She needs to be on Lipitor 80 mg daily.  Call Ms. Lovena Le with the results and send results to Glenda Chroman, MD.  Repeat lipid and liver in 10 weeks.

## 2015-07-12 NOTE — Telephone Encounter (Signed)
Pt aware of her lab work, Lipitor 80 mg #30 11 refills was send into pt pharmacy, fasting lipid liver for ordered for pt to get done in 10 weeks.

## 2015-08-01 DIAGNOSIS — Z299 Encounter for prophylactic measures, unspecified: Secondary | ICD-10-CM | POA: Diagnosis not present

## 2015-08-01 DIAGNOSIS — R300 Dysuria: Secondary | ICD-10-CM | POA: Diagnosis not present

## 2015-08-06 DIAGNOSIS — I2510 Atherosclerotic heart disease of native coronary artery without angina pectoris: Secondary | ICD-10-CM | POA: Diagnosis not present

## 2015-08-06 DIAGNOSIS — I10 Essential (primary) hypertension: Secondary | ICD-10-CM | POA: Diagnosis not present

## 2015-09-03 ENCOUNTER — Other Ambulatory Visit: Payer: Self-pay

## 2015-09-03 MED ORDER — ATORVASTATIN CALCIUM 80 MG PO TABS: 80.0000 mg | ORAL_TABLET | Freq: Every day | ORAL | 4 refills | Status: DC

## 2015-09-06 DIAGNOSIS — I10 Essential (primary) hypertension: Secondary | ICD-10-CM | POA: Diagnosis not present

## 2015-09-06 DIAGNOSIS — I2510 Atherosclerotic heart disease of native coronary artery without angina pectoris: Secondary | ICD-10-CM | POA: Diagnosis not present

## 2015-09-10 DIAGNOSIS — I779 Disorder of arteries and arterioles, unspecified: Secondary | ICD-10-CM | POA: Diagnosis not present

## 2015-09-10 DIAGNOSIS — I10 Essential (primary) hypertension: Secondary | ICD-10-CM | POA: Diagnosis not present

## 2015-09-10 DIAGNOSIS — M5416 Radiculopathy, lumbar region: Secondary | ICD-10-CM | POA: Diagnosis not present

## 2015-09-10 DIAGNOSIS — C186 Malignant neoplasm of descending colon: Secondary | ICD-10-CM | POA: Diagnosis not present

## 2015-09-18 ENCOUNTER — Ambulatory Visit (INDEPENDENT_AMBULATORY_CARE_PROVIDER_SITE_OTHER): Payer: Medicare Other | Admitting: Internal Medicine

## 2015-09-18 ENCOUNTER — Encounter (INDEPENDENT_AMBULATORY_CARE_PROVIDER_SITE_OTHER): Payer: Self-pay | Admitting: Internal Medicine

## 2015-09-18 VITALS — BP 101/64 | HR 66 | Temp 97.4°F | Resp 18 | Ht 60.0 in | Wt 89.6 lb

## 2015-09-18 DIAGNOSIS — R531 Weakness: Secondary | ICD-10-CM

## 2015-09-18 DIAGNOSIS — R634 Abnormal weight loss: Secondary | ICD-10-CM

## 2015-09-18 DIAGNOSIS — Z85038 Personal history of other malignant neoplasm of large intestine: Secondary | ICD-10-CM

## 2015-09-18 DIAGNOSIS — I2510 Atherosclerotic heart disease of native coronary artery without angina pectoris: Secondary | ICD-10-CM

## 2015-09-18 DIAGNOSIS — Z86010 Personal history of colonic polyps: Secondary | ICD-10-CM | POA: Diagnosis not present

## 2015-09-18 LAB — CBC
HCT: 33 % — ABNORMAL LOW (ref 35.0–45.0)
Hemoglobin: 10.6 g/dL — ABNORMAL LOW (ref 11.7–15.5)
MCH: 30.7 pg (ref 27.0–33.0)
MCHC: 32.1 g/dL (ref 32.0–36.0)
MCV: 95.7 fL (ref 80.0–100.0)
MPV: 10.8 fL (ref 7.5–12.5)
PLATELETS: 166 10*3/uL (ref 140–400)
RBC: 3.45 MIL/uL — ABNORMAL LOW (ref 3.80–5.10)
RDW: 14.3 % (ref 11.0–15.0)
WBC: 4.2 10*3/uL (ref 3.8–10.8)

## 2015-09-18 LAB — BASIC METABOLIC PANEL
BUN: 14 mg/dL (ref 7–25)
CO2: 29 mmol/L (ref 20–31)
Calcium: 9.3 mg/dL (ref 8.6–10.4)
Chloride: 104 mmol/L (ref 98–110)
Creat: 0.6 mg/dL (ref 0.60–0.88)
Glucose, Bld: 88 mg/dL (ref 65–99)
POTASSIUM: 4.3 mmol/L (ref 3.5–5.3)
SODIUM: 141 mmol/L (ref 135–146)

## 2015-09-18 LAB — TSH: TSH: 5.91 mIU/L — ABNORMAL HIGH

## 2015-09-18 MED ORDER — ISOSORBIDE MONONITRATE ER 60 MG PO TB24: 30.0000 mg | ORAL_TABLET | Freq: Every day | ORAL | 3 refills | Status: DC

## 2015-09-18 NOTE — Progress Notes (Signed)
Presenting complaint;  Weight loss and weakness.  Database and Subjective:  Patient is 82 year old Caucasian female who has history of colon carcinoma, status post sigmoid colon resection in 1986 whose last surveillance colonoscopy was in October 2014 with removal of 7 tubular adenomas. She was last seen in the office in December 2016 because of weight loss. She has been losing weight gradually over the last few years. Baseline weight used to be 115 pounds. She weighed 91 pounds in December 2016. TSH, cortisol level and sedimentation rate were normal.  Patient states she has lost another 2 pounds. However she believes she weighed 85 pounds 3 months ago and therefore has gained 4 pounds since then. She states she has very good appetite. She denies nausea vomiting heartburn dysphagia abdominal pain melena or rectal bleeding. She has constipation and takes stools off her. She states atorvastatin was discontinued 2 weeks ago because of side effects. She complains of feeling weak. She states she fell at home 4 days ago and has noted right ear pain. She has no difficulty walking. She also complains of feeling nervous and feels she needs medication to calm her down. She states she took care of her husband at home until 4 months ago. He is now in a nursing home. She states she is busy at home but does not do any scheduled exercise. She has not seen a cardiologist since April 2016.    Current Medications: Outpatient Encounter Prescriptions as of 09/18/2015  Medication Sig  . citalopram (CELEXA) 10 MG tablet Take 10 mg by mouth daily.  . clopidogrel (PLAVIX) 75 MG tablet Take 75 mg by mouth daily.  . cycloSPORINE (RESTASIS) 0.05 % ophthalmic emulsion Place 1 drop into both eyes daily.  Marland Kitchen docusate sodium (COLACE) 100 MG capsule Take 200 mg by mouth at bedtime.  . isosorbide mononitrate (IMDUR) 60 MG 24 hr tablet Take 1 tablet (60 mg total) by mouth daily.  . Liniments (SALONPAS) PADS Apply 1 each  topically daily as needed (pain).  . Multiple Vitamin (MULTIVITAMIN) tablet Take 1 tablet by mouth daily.  . [DISCONTINUED] atorvastatin (LIPITOR) 80 MG tablet Take 1 tablet (80 mg total) by mouth daily. (Patient not taking: Reported on 09/18/2015)   No facility-administered encounter medications on file as of 09/18/2015.      Objective: Blood pressure 101/64, pulse 66, temperature 97.4 F (36.3 C), temperature source Oral, resp. rate 18, height 5' (1.524 m), weight 89 lb 9.6 oz (40.6 kg). Thin Caucasian female who is in no acute distress. She appears very anxious. Conjunctiva is pink. Sclera is nonicteric Oropharyngeal mucosa is normal. No neck masses or thyromegaly noted. Cardiac exam with regular rhythm normal S1 and S2. Grade 2/6 SEM noted at left sternal border. Lungs are clear to auscultation. Abdomen is symmetrical soft and nontender without organomegaly or masses. No LE edema or clubbing noted. Large ecchymosis noted extending from right hip to mid thigh. It is soft and mildly tender. Skin is intact.  Labs/studies Results: Lab data from 07/06/2015   bilirubin 0.4, AP 47, AST 16.7, ALT 15, total protein 5.8 and albumin 3.7  Assessment:  #1. Weight loss. Patient has lost another 2 pounds since her last visit of December 2016. At that time she had normal TSH and cortisol level. Sedimentation rate was also normal at 4. She believes she has good appetite but this is her subjective assessment and may not be the case. #2. Weakness. Patient fell 4 days ago but did not go to emergency room.  Weakness possibly secondary to imdur. Will check CBC to make sure she is not anemic. She has large ecchymosis to right hip but not enough to induce anemia. #3. History of colon carcinoma and colonic polyps. Patient had 7 tubular adenomas removed on colonoscopy of October 2014. She is interested in follow-up exam. #4. Large ecchymosis and right hip and thigh secondary to recent fall in a patient who is  on clopidogrel.   Plan:  Decrease Imdur to 30 mg by mouth daily. Patient will go to the lab for CBC, metabolic 7 and TSH. Patient to follow-up with Dr. Woody Seller for management of her stress disorder. Colonoscopy within the next 2 to 3 months. Office visit in 6 months.

## 2015-09-18 NOTE — Patient Instructions (Signed)
Follow-up with Dr. Woody Seller regarding stress disorder. Please make sure Dr. Woody Seller agrees with imdur dose reduction. Physician will call with results of blood tests. Colonoscopy be scheduled in October or November 2017.

## 2015-09-21 ENCOUNTER — Other Ambulatory Visit (INDEPENDENT_AMBULATORY_CARE_PROVIDER_SITE_OTHER): Payer: Self-pay | Admitting: *Deleted

## 2015-09-21 DIAGNOSIS — Z85038 Personal history of other malignant neoplasm of large intestine: Secondary | ICD-10-CM

## 2015-09-21 DIAGNOSIS — R634 Abnormal weight loss: Secondary | ICD-10-CM

## 2015-09-21 DIAGNOSIS — Z79899 Other long term (current) drug therapy: Secondary | ICD-10-CM

## 2015-09-21 DIAGNOSIS — R531 Weakness: Secondary | ICD-10-CM

## 2015-09-21 DIAGNOSIS — Z86010 Personal history of colonic polyps: Secondary | ICD-10-CM

## 2015-09-21 DIAGNOSIS — D508 Other iron deficiency anemias: Secondary | ICD-10-CM

## 2015-09-25 DIAGNOSIS — Z85038 Personal history of other malignant neoplasm of large intestine: Secondary | ICD-10-CM | POA: Diagnosis not present

## 2015-09-25 DIAGNOSIS — Z79899 Other long term (current) drug therapy: Secondary | ICD-10-CM | POA: Diagnosis not present

## 2015-09-25 DIAGNOSIS — R634 Abnormal weight loss: Secondary | ICD-10-CM | POA: Diagnosis not present

## 2015-09-25 DIAGNOSIS — Z86010 Personal history of colonic polyps: Secondary | ICD-10-CM | POA: Diagnosis not present

## 2015-09-25 DIAGNOSIS — D508 Other iron deficiency anemias: Secondary | ICD-10-CM | POA: Diagnosis not present

## 2015-09-25 DIAGNOSIS — R531 Weakness: Secondary | ICD-10-CM | POA: Diagnosis not present

## 2015-09-26 LAB — FERRITIN: FERRITIN: 25 ng/mL (ref 20–288)

## 2015-09-26 LAB — FOLATE: Folate: >24 ng/mL (ref >5.4)

## 2015-09-26 LAB — IRON AND TIBC
%SAT: 24 % (ref 11–50)
IRON: 82 ug/dL (ref 45–160)
TIBC: 343 ug/dL (ref 250–450)
UIBC: 261 ug/dL (ref 125–400)

## 2015-09-26 LAB — TSH: TSH: 4.3 mIU/L

## 2015-09-26 LAB — VITAMIN B12: Vitamin B-12: 791 pg/mL (ref 200–1100)

## 2015-09-27 DIAGNOSIS — T148 Other injury of unspecified body region: Secondary | ICD-10-CM | POA: Diagnosis not present

## 2015-09-27 DIAGNOSIS — I10 Essential (primary) hypertension: Secondary | ICD-10-CM | POA: Diagnosis not present

## 2015-10-08 DIAGNOSIS — I10 Essential (primary) hypertension: Secondary | ICD-10-CM | POA: Diagnosis not present

## 2015-10-08 DIAGNOSIS — I2510 Atherosclerotic heart disease of native coronary artery without angina pectoris: Secondary | ICD-10-CM | POA: Diagnosis not present

## 2015-10-29 DIAGNOSIS — R079 Chest pain, unspecified: Secondary | ICD-10-CM | POA: Diagnosis not present

## 2015-10-29 DIAGNOSIS — J449 Chronic obstructive pulmonary disease, unspecified: Secondary | ICD-10-CM | POA: Diagnosis not present

## 2015-10-29 DIAGNOSIS — C186 Malignant neoplasm of descending colon: Secondary | ICD-10-CM | POA: Diagnosis not present

## 2015-10-29 DIAGNOSIS — R05 Cough: Secondary | ICD-10-CM | POA: Diagnosis not present

## 2015-10-29 DIAGNOSIS — G7000 Myasthenia gravis without (acute) exacerbation: Secondary | ICD-10-CM | POA: Diagnosis not present

## 2015-10-29 DIAGNOSIS — Z85038 Personal history of other malignant neoplasm of large intestine: Secondary | ICD-10-CM | POA: Diagnosis not present

## 2015-10-29 DIAGNOSIS — Z87891 Personal history of nicotine dependence: Secondary | ICD-10-CM | POA: Diagnosis not present

## 2015-10-29 DIAGNOSIS — I700 Atherosclerosis of aorta: Secondary | ICD-10-CM | POA: Diagnosis not present

## 2015-10-29 DIAGNOSIS — I779 Disorder of arteries and arterioles, unspecified: Secondary | ICD-10-CM | POA: Diagnosis not present

## 2015-10-29 DIAGNOSIS — J40 Bronchitis, not specified as acute or chronic: Secondary | ICD-10-CM | POA: Diagnosis not present

## 2015-11-01 ENCOUNTER — Encounter (INDEPENDENT_AMBULATORY_CARE_PROVIDER_SITE_OTHER): Payer: Self-pay | Admitting: *Deleted

## 2015-12-03 DIAGNOSIS — I2510 Atherosclerotic heart disease of native coronary artery without angina pectoris: Secondary | ICD-10-CM | POA: Diagnosis not present

## 2015-12-03 DIAGNOSIS — I10 Essential (primary) hypertension: Secondary | ICD-10-CM | POA: Diagnosis not present

## 2015-12-14 DIAGNOSIS — F4320 Adjustment disorder, unspecified: Secondary | ICD-10-CM | POA: Diagnosis not present

## 2015-12-14 DIAGNOSIS — M25512 Pain in left shoulder: Secondary | ICD-10-CM | POA: Diagnosis not present

## 2015-12-14 DIAGNOSIS — J069 Acute upper respiratory infection, unspecified: Secondary | ICD-10-CM | POA: Diagnosis not present

## 2015-12-14 DIAGNOSIS — Z87891 Personal history of nicotine dependence: Secondary | ICD-10-CM | POA: Diagnosis not present

## 2015-12-14 DIAGNOSIS — Z299 Encounter for prophylactic measures, unspecified: Secondary | ICD-10-CM | POA: Diagnosis not present

## 2016-01-22 DIAGNOSIS — Z299 Encounter for prophylactic measures, unspecified: Secondary | ICD-10-CM | POA: Diagnosis not present

## 2016-01-22 DIAGNOSIS — I10 Essential (primary) hypertension: Secondary | ICD-10-CM | POA: Diagnosis not present

## 2016-01-22 DIAGNOSIS — Z789 Other specified health status: Secondary | ICD-10-CM | POA: Diagnosis not present

## 2016-01-22 DIAGNOSIS — Z681 Body mass index (BMI) 19 or less, adult: Secondary | ICD-10-CM | POA: Diagnosis not present

## 2016-01-22 DIAGNOSIS — R471 Dysarthria and anarthria: Secondary | ICD-10-CM | POA: Diagnosis not present

## 2016-01-22 DIAGNOSIS — I779 Disorder of arteries and arterioles, unspecified: Secondary | ICD-10-CM | POA: Diagnosis not present

## 2016-01-25 DIAGNOSIS — I10 Essential (primary) hypertension: Secondary | ICD-10-CM | POA: Diagnosis not present

## 2016-01-25 DIAGNOSIS — I2510 Atherosclerotic heart disease of native coronary artery without angina pectoris: Secondary | ICD-10-CM | POA: Diagnosis not present

## 2016-03-05 DIAGNOSIS — I2510 Atherosclerotic heart disease of native coronary artery without angina pectoris: Secondary | ICD-10-CM | POA: Diagnosis not present

## 2016-03-05 DIAGNOSIS — I10 Essential (primary) hypertension: Secondary | ICD-10-CM | POA: Diagnosis not present

## 2016-03-18 ENCOUNTER — Encounter (INDEPENDENT_AMBULATORY_CARE_PROVIDER_SITE_OTHER): Payer: Self-pay | Admitting: Internal Medicine

## 2016-03-18 ENCOUNTER — Ambulatory Visit (INDEPENDENT_AMBULATORY_CARE_PROVIDER_SITE_OTHER): Payer: Medicare Other | Admitting: Internal Medicine

## 2016-03-18 ENCOUNTER — Encounter (INDEPENDENT_AMBULATORY_CARE_PROVIDER_SITE_OTHER): Payer: Self-pay

## 2016-03-18 VITALS — BP 110/62 | HR 60 | Temp 98.1°F | Resp 18 | Ht 60.0 in | Wt 88.3 lb

## 2016-03-18 DIAGNOSIS — D649 Anemia, unspecified: Secondary | ICD-10-CM | POA: Diagnosis not present

## 2016-03-18 DIAGNOSIS — Z85038 Personal history of other malignant neoplasm of large intestine: Secondary | ICD-10-CM

## 2016-03-18 DIAGNOSIS — R634 Abnormal weight loss: Secondary | ICD-10-CM | POA: Diagnosis not present

## 2016-03-18 DIAGNOSIS — Z86010 Personal history of colonic polyps: Secondary | ICD-10-CM | POA: Diagnosis not present

## 2016-03-18 LAB — COMPREHENSIVE METABOLIC PANEL WITH GFR
ALT: 19 U/L (ref 6–29)
AST: 19 U/L (ref 10–35)
Albumin: 3.8 g/dL (ref 3.6–5.1)
Alkaline Phosphatase: 55 U/L (ref 33–130)
BUN: 12 mg/dL (ref 7–25)
CO2: 29 mmol/L (ref 20–31)
Calcium: 9 mg/dL (ref 8.6–10.4)
Chloride: 104 mmol/L (ref 98–110)
Creat: 0.58 mg/dL — ABNORMAL LOW (ref 0.60–0.88)
Glucose, Bld: 80 mg/dL (ref 65–99)
Potassium: 4.4 mmol/L (ref 3.5–5.3)
Sodium: 140 mmol/L (ref 135–146)
Total Bilirubin: 0.5 mg/dL (ref 0.2–1.2)
Total Protein: 6.2 g/dL (ref 6.1–8.1)

## 2016-03-18 LAB — CBC
HCT: 36.5 % (ref 35.0–45.0)
Hemoglobin: 11.5 g/dL — ABNORMAL LOW (ref 11.7–15.5)
MCH: 30.2 pg (ref 27.0–33.0)
MCHC: 31.5 g/dL — ABNORMAL LOW (ref 32.0–36.0)
MCV: 95.8 fL (ref 80.0–100.0)
MPV: 11.1 fL (ref 7.5–12.5)
PLATELETS: 161 10*3/uL (ref 140–400)
RBC: 3.81 MIL/uL (ref 3.80–5.10)
RDW: 14.2 % (ref 11.0–15.0)
WBC: 4 10*3/uL (ref 3.8–10.8)

## 2016-03-18 NOTE — Progress Notes (Signed)
Presenting complaint;  Follow-up for weight loss. History of colon cancer and colonic polyps. Was to discuss timing of next colonoscopy.  Subjective:  Patient is 83 year old Caucasian female who is here for scheduled visit. She was last seen 6 months ago at which time she weighed 89 pounds. Today she weighs 88 pounds. She states she eats all the time. She does not understand why she is not gaining weight. She states she had been looking after her husband been sick for 3 years and he died after he was placed in a nursing home for 6 weeks. She lives alone but has very good support. She says she still has difficult moments when she feels lost and cries. She has good neighbors and friends. Her grandson is Supervising her rental properties. She has 2 people help with cleaning. She says she eats 3 meals and snacks in between. She continues to complain of constipation. She states she passes little balls. This is in spite of taking polyethylene glycol every day. She also complains of insomnia. She denies heartburn dysphagia nausea vomiting melena or rectal bleeding. She also wants to know when she can undergo colonoscopy. Last colonoscopy was in October 2014.   Current Medications: Outpatient Encounter Prescriptions as of 03/18/2016  Medication Sig  . atorvastatin (LIPITOR) 80 MG tablet Take 80 mg by mouth daily.   . citalopram (CELEXA) 10 MG tablet Take 10 mg by mouth daily.  . clopidogrel (PLAVIX) 75 MG tablet Take 75 mg by mouth daily.  . cycloSPORINE (RESTASIS) 0.05 % ophthalmic emulsion Place 1 drop into both eyes daily.  . isosorbide mononitrate (IMDUR) 60 MG 24 hr tablet Take 0.5 tablets (30 mg total) by mouth daily.  . Liniments (SALONPAS) PADS Apply 1 each topically daily as needed (pain).  . Multiple Vitamin (MULTIVITAMIN) tablet Take 1 tablet by mouth daily.  . polyethylene glycol powder (GLYCOLAX/MIRALAX) powder Take 1 Container by mouth at bedtime.   . [DISCONTINUED] docusate sodium  (COLACE) 100 MG capsule Take 200 mg by mouth at bedtime.   No facility-administered encounter medications on file as of 03/18/2016.      Objective: Blood pressure 110/62, pulse 60, temperature 98.1 F (36.7 C), temperature source Oral, resp. rate 18, height 5' (1.524 m), weight 88 lb 5 oz (40.1 kg). Patient is alert and in no acute distress. She is thin with generalized wasting. Conjunctiva is pink. Sclera is nonicteric Oropharyngeal mucosa is normal. No neck masses or thyromegaly noted. Cardiac exam with regular rhythm normal S1 and S2. No murmur or gallop noted. Lungs are clear to auscultation. Abdomen is flat and soft without tenderness organomegaly or masses. No LE edema or clubbing noted.  Labs/studies Results: Lab data on 09/18/2015  WBC 4.2, H&H 10.6 and 33.0. MCV 95.7 and platelet count 166K Serum iron 82 TIBC 343 and saturation 24%..   B12 791 and folate greater than 24.  Assessment:  #1.History of colon carcinoma and colonic adenomas. Last colonoscopy was in October 2014 with removal of 4 polyps including a 12 mm sessile cecal polyp. Therefore follow-up exam would be appropriate when she feels well enough to take the prep.  #2. Weight loss. She has been gradually losing weight over the last few years. However she has only lost 1 pound since her last visit. Weight loss does not appear to be due to poor intake as she appears to be eating very well. TSH 6 months ago was normal.  #3. History of anemia. Anemia was encountered on her last visit 6  months ago. Iron studies as well as B12 and folate levels were normal.   Plan:  Patient encouraged to increase consumption of fiber rich foods such as apples prunes in pairs. Patient will go to the lab for CBC and comprehensive chemistry panel. Weight check in 8 weeks. Consider colonoscopy after weight check in 8 weeks.

## 2016-03-18 NOTE — Patient Instructions (Signed)
Increase intake of fiber rich foods such as apples prunes and pears. Physician will call with results of blood tests when completed Weight check in 8 weeks. Will schedule colonoscopy after weight check in 8 weeks.

## 2016-03-27 ENCOUNTER — Other Ambulatory Visit: Payer: Self-pay

## 2016-03-27 MED ORDER — ATORVASTATIN CALCIUM 80 MG PO TABS: 80.0000 mg | ORAL_TABLET | Freq: Every day | ORAL | 0 refills | Status: DC

## 2016-04-02 DIAGNOSIS — I700 Atherosclerosis of aorta: Secondary | ICD-10-CM | POA: Diagnosis not present

## 2016-04-02 DIAGNOSIS — S2020XA Contusion of thorax, unspecified, initial encounter: Secondary | ICD-10-CM | POA: Diagnosis not present

## 2016-04-02 DIAGNOSIS — Z681 Body mass index (BMI) 19 or less, adult: Secondary | ICD-10-CM | POA: Diagnosis not present

## 2016-04-02 DIAGNOSIS — R0789 Other chest pain: Secondary | ICD-10-CM | POA: Diagnosis not present

## 2016-04-02 DIAGNOSIS — R0781 Pleurodynia: Secondary | ICD-10-CM | POA: Diagnosis not present

## 2016-04-02 DIAGNOSIS — Z87891 Personal history of nicotine dependence: Secondary | ICD-10-CM | POA: Diagnosis not present

## 2016-04-02 DIAGNOSIS — R079 Chest pain, unspecified: Secondary | ICD-10-CM | POA: Diagnosis not present

## 2016-04-02 DIAGNOSIS — Z299 Encounter for prophylactic measures, unspecified: Secondary | ICD-10-CM | POA: Diagnosis not present

## 2016-04-02 DIAGNOSIS — I779 Disorder of arteries and arterioles, unspecified: Secondary | ICD-10-CM | POA: Diagnosis not present

## 2016-04-02 DIAGNOSIS — J449 Chronic obstructive pulmonary disease, unspecified: Secondary | ICD-10-CM | POA: Diagnosis not present

## 2016-04-02 DIAGNOSIS — Z95818 Presence of other cardiac implants and grafts: Secondary | ICD-10-CM | POA: Diagnosis not present

## 2016-04-02 DIAGNOSIS — E7800 Pure hypercholesterolemia, unspecified: Secondary | ICD-10-CM | POA: Diagnosis not present

## 2016-04-03 DIAGNOSIS — I2510 Atherosclerotic heart disease of native coronary artery without angina pectoris: Secondary | ICD-10-CM | POA: Diagnosis not present

## 2016-04-03 DIAGNOSIS — I10 Essential (primary) hypertension: Secondary | ICD-10-CM | POA: Diagnosis not present

## 2016-04-10 DIAGNOSIS — S82001A Unspecified fracture of right patella, initial encounter for closed fracture: Secondary | ICD-10-CM | POA: Diagnosis not present

## 2016-04-10 DIAGNOSIS — S0181XA Laceration without foreign body of other part of head, initial encounter: Secondary | ICD-10-CM | POA: Diagnosis not present

## 2016-04-10 DIAGNOSIS — Z87440 Personal history of urinary (tract) infections: Secondary | ICD-10-CM | POA: Diagnosis not present

## 2016-04-10 DIAGNOSIS — M7989 Other specified soft tissue disorders: Secondary | ICD-10-CM | POA: Diagnosis not present

## 2016-04-10 DIAGNOSIS — E7800 Pure hypercholesterolemia, unspecified: Secondary | ICD-10-CM | POA: Diagnosis not present

## 2016-04-10 DIAGNOSIS — Z8249 Family history of ischemic heart disease and other diseases of the circulatory system: Secondary | ICD-10-CM | POA: Diagnosis not present

## 2016-04-10 DIAGNOSIS — Z7902 Long term (current) use of antithrombotics/antiplatelets: Secondary | ICD-10-CM | POA: Diagnosis not present

## 2016-04-10 DIAGNOSIS — W1839XA Other fall on same level, initial encounter: Secondary | ICD-10-CM | POA: Diagnosis not present

## 2016-04-10 DIAGNOSIS — Z888 Allergy status to other drugs, medicaments and biological substances status: Secondary | ICD-10-CM | POA: Diagnosis not present

## 2016-04-10 DIAGNOSIS — Z79899 Other long term (current) drug therapy: Secondary | ICD-10-CM | POA: Diagnosis not present

## 2016-04-10 DIAGNOSIS — M40202 Unspecified kyphosis, cervical region: Secondary | ICD-10-CM | POA: Diagnosis not present

## 2016-04-10 DIAGNOSIS — S0511XA Contusion of eyeball and orbital tissues, right eye, initial encounter: Secondary | ICD-10-CM | POA: Diagnosis not present

## 2016-04-10 DIAGNOSIS — S0990XA Unspecified injury of head, initial encounter: Secondary | ICD-10-CM | POA: Diagnosis not present

## 2016-04-10 DIAGNOSIS — S199XXA Unspecified injury of neck, initial encounter: Secondary | ICD-10-CM | POA: Diagnosis not present

## 2016-04-10 DIAGNOSIS — M810 Age-related osteoporosis without current pathological fracture: Secondary | ICD-10-CM | POA: Diagnosis not present

## 2016-04-10 DIAGNOSIS — S63254A Unspecified dislocation of right ring finger, initial encounter: Secondary | ICD-10-CM | POA: Diagnosis not present

## 2016-04-10 DIAGNOSIS — Z85038 Personal history of other malignant neoplasm of large intestine: Secondary | ICD-10-CM | POA: Diagnosis not present

## 2016-04-10 DIAGNOSIS — E559 Vitamin D deficiency, unspecified: Secondary | ICD-10-CM | POA: Diagnosis not present

## 2016-04-10 DIAGNOSIS — Z9049 Acquired absence of other specified parts of digestive tract: Secondary | ICD-10-CM | POA: Diagnosis not present

## 2016-04-10 DIAGNOSIS — I6789 Other cerebrovascular disease: Secondary | ICD-10-CM | POA: Diagnosis not present

## 2016-04-10 DIAGNOSIS — F329 Major depressive disorder, single episode, unspecified: Secondary | ICD-10-CM | POA: Diagnosis not present

## 2016-04-10 DIAGNOSIS — Z7982 Long term (current) use of aspirin: Secondary | ICD-10-CM | POA: Diagnosis not present

## 2016-04-10 DIAGNOSIS — Z86010 Personal history of colonic polyps: Secondary | ICD-10-CM | POA: Diagnosis not present

## 2016-04-10 DIAGNOSIS — T148XXA Other injury of unspecified body region, initial encounter: Secondary | ICD-10-CM | POA: Diagnosis not present

## 2016-04-10 DIAGNOSIS — S63284A Dislocation of proximal interphalangeal joint of right ring finger, initial encounter: Secondary | ICD-10-CM | POA: Diagnosis not present

## 2016-04-10 DIAGNOSIS — S0003XA Contusion of scalp, initial encounter: Secondary | ICD-10-CM | POA: Diagnosis not present

## 2016-04-10 DIAGNOSIS — S0083XA Contusion of other part of head, initial encounter: Secondary | ICD-10-CM | POA: Diagnosis not present

## 2016-04-10 DIAGNOSIS — S6992XA Unspecified injury of left wrist, hand and finger(s), initial encounter: Secondary | ICD-10-CM | POA: Diagnosis not present

## 2016-04-10 DIAGNOSIS — R531 Weakness: Secondary | ICD-10-CM | POA: Diagnosis not present

## 2016-04-10 DIAGNOSIS — S63282A Dislocation of proximal interphalangeal joint of right middle finger, initial encounter: Secondary | ICD-10-CM | POA: Diagnosis not present

## 2016-04-10 DIAGNOSIS — M47817 Spondylosis without myelopathy or radiculopathy, lumbosacral region: Secondary | ICD-10-CM | POA: Diagnosis not present

## 2016-04-10 DIAGNOSIS — Z955 Presence of coronary angioplasty implant and graft: Secondary | ICD-10-CM | POA: Diagnosis not present

## 2016-04-10 DIAGNOSIS — I2510 Atherosclerotic heart disease of native coronary artery without angina pectoris: Secondary | ICD-10-CM | POA: Diagnosis not present

## 2016-04-10 DIAGNOSIS — R269 Unspecified abnormalities of gait and mobility: Secondary | ICD-10-CM | POA: Diagnosis not present

## 2016-04-10 DIAGNOSIS — Z818 Family history of other mental and behavioral disorders: Secondary | ICD-10-CM | POA: Diagnosis not present

## 2016-04-11 DIAGNOSIS — S63284A Dislocation of proximal interphalangeal joint of right ring finger, initial encounter: Secondary | ICD-10-CM | POA: Diagnosis not present

## 2016-04-11 DIAGNOSIS — S0990XA Unspecified injury of head, initial encounter: Secondary | ICD-10-CM | POA: Diagnosis not present

## 2016-04-11 DIAGNOSIS — S0083XA Contusion of other part of head, initial encounter: Secondary | ICD-10-CM | POA: Diagnosis not present

## 2016-04-11 DIAGNOSIS — S82001A Unspecified fracture of right patella, initial encounter for closed fracture: Secondary | ICD-10-CM | POA: Diagnosis not present

## 2016-04-12 DIAGNOSIS — S0990XA Unspecified injury of head, initial encounter: Secondary | ICD-10-CM | POA: Diagnosis not present

## 2016-04-12 DIAGNOSIS — S82001A Unspecified fracture of right patella, initial encounter for closed fracture: Secondary | ICD-10-CM | POA: Diagnosis not present

## 2016-04-12 DIAGNOSIS — S63284A Dislocation of proximal interphalangeal joint of right ring finger, initial encounter: Secondary | ICD-10-CM | POA: Diagnosis not present

## 2016-04-12 DIAGNOSIS — S0083XA Contusion of other part of head, initial encounter: Secondary | ICD-10-CM | POA: Diagnosis not present

## 2016-04-13 DIAGNOSIS — S0083XA Contusion of other part of head, initial encounter: Secondary | ICD-10-CM | POA: Diagnosis not present

## 2016-04-13 DIAGNOSIS — S82001A Unspecified fracture of right patella, initial encounter for closed fracture: Secondary | ICD-10-CM | POA: Diagnosis not present

## 2016-04-13 DIAGNOSIS — S0990XA Unspecified injury of head, initial encounter: Secondary | ICD-10-CM | POA: Diagnosis not present

## 2016-04-13 DIAGNOSIS — S63284A Dislocation of proximal interphalangeal joint of right ring finger, initial encounter: Secondary | ICD-10-CM | POA: Diagnosis not present

## 2016-04-14 DIAGNOSIS — S63254A Unspecified dislocation of right ring finger, initial encounter: Secondary | ICD-10-CM | POA: Diagnosis not present

## 2016-04-14 DIAGNOSIS — S8001XA Contusion of right knee, initial encounter: Secondary | ICD-10-CM | POA: Diagnosis not present

## 2016-04-17 DIAGNOSIS — I2510 Atherosclerotic heart disease of native coronary artery without angina pectoris: Secondary | ICD-10-CM | POA: Diagnosis not present

## 2016-04-17 DIAGNOSIS — F329 Major depressive disorder, single episode, unspecified: Secondary | ICD-10-CM | POA: Diagnosis not present

## 2016-04-17 DIAGNOSIS — M810 Age-related osteoporosis without current pathological fracture: Secondary | ICD-10-CM | POA: Diagnosis not present

## 2016-04-17 DIAGNOSIS — S82001D Unspecified fracture of right patella, subsequent encounter for closed fracture with routine healing: Secondary | ICD-10-CM | POA: Diagnosis not present

## 2016-04-17 DIAGNOSIS — S0990XD Unspecified injury of head, subsequent encounter: Secondary | ICD-10-CM | POA: Diagnosis not present

## 2016-04-17 DIAGNOSIS — M1990 Unspecified osteoarthritis, unspecified site: Secondary | ICD-10-CM | POA: Diagnosis not present

## 2016-04-17 DIAGNOSIS — E559 Vitamin D deficiency, unspecified: Secondary | ICD-10-CM | POA: Diagnosis not present

## 2016-04-17 DIAGNOSIS — W19XXXD Unspecified fall, subsequent encounter: Secondary | ICD-10-CM | POA: Diagnosis not present

## 2016-04-17 DIAGNOSIS — Z85038 Personal history of other malignant neoplasm of large intestine: Secondary | ICD-10-CM | POA: Diagnosis not present

## 2016-04-17 DIAGNOSIS — Z7902 Long term (current) use of antithrombotics/antiplatelets: Secondary | ICD-10-CM | POA: Diagnosis not present

## 2016-04-17 DIAGNOSIS — Z955 Presence of coronary angioplasty implant and graft: Secondary | ICD-10-CM | POA: Diagnosis not present

## 2016-04-17 DIAGNOSIS — S62636D Displaced fracture of distal phalanx of right little finger, subsequent encounter for fracture with routine healing: Secondary | ICD-10-CM | POA: Diagnosis not present

## 2016-04-18 DIAGNOSIS — I2510 Atherosclerotic heart disease of native coronary artery without angina pectoris: Secondary | ICD-10-CM | POA: Diagnosis not present

## 2016-04-18 DIAGNOSIS — F329 Major depressive disorder, single episode, unspecified: Secondary | ICD-10-CM | POA: Diagnosis not present

## 2016-04-18 DIAGNOSIS — S62636D Displaced fracture of distal phalanx of right little finger, subsequent encounter for fracture with routine healing: Secondary | ICD-10-CM | POA: Diagnosis not present

## 2016-04-18 DIAGNOSIS — S0990XD Unspecified injury of head, subsequent encounter: Secondary | ICD-10-CM | POA: Diagnosis not present

## 2016-04-18 DIAGNOSIS — S82001D Unspecified fracture of right patella, subsequent encounter for closed fracture with routine healing: Secondary | ICD-10-CM | POA: Diagnosis not present

## 2016-04-18 DIAGNOSIS — E559 Vitamin D deficiency, unspecified: Secondary | ICD-10-CM | POA: Diagnosis not present

## 2016-04-23 DIAGNOSIS — I2510 Atherosclerotic heart disease of native coronary artery without angina pectoris: Secondary | ICD-10-CM | POA: Diagnosis not present

## 2016-04-23 DIAGNOSIS — E559 Vitamin D deficiency, unspecified: Secondary | ICD-10-CM | POA: Diagnosis not present

## 2016-04-23 DIAGNOSIS — S62636D Displaced fracture of distal phalanx of right little finger, subsequent encounter for fracture with routine healing: Secondary | ICD-10-CM | POA: Diagnosis not present

## 2016-04-23 DIAGNOSIS — S82001D Unspecified fracture of right patella, subsequent encounter for closed fracture with routine healing: Secondary | ICD-10-CM | POA: Diagnosis not present

## 2016-04-23 DIAGNOSIS — F329 Major depressive disorder, single episode, unspecified: Secondary | ICD-10-CM | POA: Diagnosis not present

## 2016-04-23 DIAGNOSIS — S0990XD Unspecified injury of head, subsequent encounter: Secondary | ICD-10-CM | POA: Diagnosis not present

## 2016-04-25 DIAGNOSIS — S82001D Unspecified fracture of right patella, subsequent encounter for closed fracture with routine healing: Secondary | ICD-10-CM | POA: Diagnosis not present

## 2016-04-25 DIAGNOSIS — S62636D Displaced fracture of distal phalanx of right little finger, subsequent encounter for fracture with routine healing: Secondary | ICD-10-CM | POA: Diagnosis not present

## 2016-04-25 DIAGNOSIS — I2510 Atherosclerotic heart disease of native coronary artery without angina pectoris: Secondary | ICD-10-CM | POA: Diagnosis not present

## 2016-04-25 DIAGNOSIS — E559 Vitamin D deficiency, unspecified: Secondary | ICD-10-CM | POA: Diagnosis not present

## 2016-04-25 DIAGNOSIS — F329 Major depressive disorder, single episode, unspecified: Secondary | ICD-10-CM | POA: Diagnosis not present

## 2016-04-25 DIAGNOSIS — S0990XD Unspecified injury of head, subsequent encounter: Secondary | ICD-10-CM | POA: Diagnosis not present

## 2016-04-28 DIAGNOSIS — S63634A Sprain of interphalangeal joint of right ring finger, initial encounter: Secondary | ICD-10-CM | POA: Diagnosis not present

## 2016-04-29 DIAGNOSIS — S82001D Unspecified fracture of right patella, subsequent encounter for closed fracture with routine healing: Secondary | ICD-10-CM | POA: Diagnosis not present

## 2016-04-29 DIAGNOSIS — F329 Major depressive disorder, single episode, unspecified: Secondary | ICD-10-CM | POA: Diagnosis not present

## 2016-04-29 DIAGNOSIS — S62636D Displaced fracture of distal phalanx of right little finger, subsequent encounter for fracture with routine healing: Secondary | ICD-10-CM | POA: Diagnosis not present

## 2016-04-29 DIAGNOSIS — E559 Vitamin D deficiency, unspecified: Secondary | ICD-10-CM | POA: Diagnosis not present

## 2016-04-29 DIAGNOSIS — I2510 Atherosclerotic heart disease of native coronary artery without angina pectoris: Secondary | ICD-10-CM | POA: Diagnosis not present

## 2016-04-29 DIAGNOSIS — S0990XD Unspecified injury of head, subsequent encounter: Secondary | ICD-10-CM | POA: Diagnosis not present

## 2016-04-30 DIAGNOSIS — S0990XD Unspecified injury of head, subsequent encounter: Secondary | ICD-10-CM | POA: Diagnosis not present

## 2016-04-30 DIAGNOSIS — E559 Vitamin D deficiency, unspecified: Secondary | ICD-10-CM | POA: Diagnosis not present

## 2016-04-30 DIAGNOSIS — S62636D Displaced fracture of distal phalanx of right little finger, subsequent encounter for fracture with routine healing: Secondary | ICD-10-CM | POA: Diagnosis not present

## 2016-04-30 DIAGNOSIS — S82001D Unspecified fracture of right patella, subsequent encounter for closed fracture with routine healing: Secondary | ICD-10-CM | POA: Diagnosis not present

## 2016-04-30 DIAGNOSIS — F329 Major depressive disorder, single episode, unspecified: Secondary | ICD-10-CM | POA: Diagnosis not present

## 2016-04-30 DIAGNOSIS — I2510 Atherosclerotic heart disease of native coronary artery without angina pectoris: Secondary | ICD-10-CM | POA: Diagnosis not present

## 2016-05-02 DIAGNOSIS — Y999 Unspecified external cause status: Secondary | ICD-10-CM | POA: Diagnosis not present

## 2016-05-02 DIAGNOSIS — S63234A Subluxation of proximal interphalangeal joint of right ring finger, initial encounter: Secondary | ICD-10-CM | POA: Diagnosis not present

## 2016-05-06 DIAGNOSIS — S62636D Displaced fracture of distal phalanx of right little finger, subsequent encounter for fracture with routine healing: Secondary | ICD-10-CM | POA: Diagnosis not present

## 2016-05-06 DIAGNOSIS — S0990XD Unspecified injury of head, subsequent encounter: Secondary | ICD-10-CM | POA: Diagnosis not present

## 2016-05-06 DIAGNOSIS — E559 Vitamin D deficiency, unspecified: Secondary | ICD-10-CM | POA: Diagnosis not present

## 2016-05-06 DIAGNOSIS — S82001D Unspecified fracture of right patella, subsequent encounter for closed fracture with routine healing: Secondary | ICD-10-CM | POA: Diagnosis not present

## 2016-05-06 DIAGNOSIS — I2510 Atherosclerotic heart disease of native coronary artery without angina pectoris: Secondary | ICD-10-CM | POA: Diagnosis not present

## 2016-05-06 DIAGNOSIS — F329 Major depressive disorder, single episode, unspecified: Secondary | ICD-10-CM | POA: Diagnosis not present

## 2016-05-08 DIAGNOSIS — S0990XD Unspecified injury of head, subsequent encounter: Secondary | ICD-10-CM | POA: Diagnosis not present

## 2016-05-08 DIAGNOSIS — S82001D Unspecified fracture of right patella, subsequent encounter for closed fracture with routine healing: Secondary | ICD-10-CM | POA: Diagnosis not present

## 2016-05-08 DIAGNOSIS — I2510 Atherosclerotic heart disease of native coronary artery without angina pectoris: Secondary | ICD-10-CM | POA: Diagnosis not present

## 2016-05-08 DIAGNOSIS — S62636D Displaced fracture of distal phalanx of right little finger, subsequent encounter for fracture with routine healing: Secondary | ICD-10-CM | POA: Diagnosis not present

## 2016-05-08 DIAGNOSIS — E559 Vitamin D deficiency, unspecified: Secondary | ICD-10-CM | POA: Diagnosis not present

## 2016-05-08 DIAGNOSIS — F329 Major depressive disorder, single episode, unspecified: Secondary | ICD-10-CM | POA: Diagnosis not present

## 2016-05-15 DIAGNOSIS — M79644 Pain in right finger(s): Secondary | ICD-10-CM | POA: Diagnosis not present

## 2016-05-15 DIAGNOSIS — T148XXA Other injury of unspecified body region, initial encounter: Secondary | ICD-10-CM | POA: Diagnosis not present

## 2016-05-15 DIAGNOSIS — S63234D Subluxation of proximal interphalangeal joint of right ring finger, subsequent encounter: Secondary | ICD-10-CM | POA: Diagnosis not present

## 2016-05-15 DIAGNOSIS — M25649 Stiffness of unspecified hand, not elsewhere classified: Secondary | ICD-10-CM | POA: Diagnosis not present

## 2016-05-16 DIAGNOSIS — S63284D Dislocation of proximal interphalangeal joint of right ring finger, subsequent encounter: Secondary | ICD-10-CM | POA: Diagnosis not present

## 2016-05-16 DIAGNOSIS — Z4801 Encounter for change or removal of surgical wound dressing: Secondary | ICD-10-CM | POA: Diagnosis not present

## 2016-05-23 DIAGNOSIS — I2510 Atherosclerotic heart disease of native coronary artery without angina pectoris: Secondary | ICD-10-CM | POA: Diagnosis not present

## 2016-05-23 DIAGNOSIS — I10 Essential (primary) hypertension: Secondary | ICD-10-CM | POA: Diagnosis not present

## 2016-06-05 DIAGNOSIS — S63634D Sprain of interphalangeal joint of right ring finger, subsequent encounter: Secondary | ICD-10-CM | POA: Diagnosis not present

## 2016-06-18 DIAGNOSIS — B351 Tinea unguium: Secondary | ICD-10-CM | POA: Diagnosis not present

## 2016-06-18 DIAGNOSIS — M1990 Unspecified osteoarthritis, unspecified site: Secondary | ICD-10-CM | POA: Diagnosis not present

## 2016-06-18 DIAGNOSIS — M79672 Pain in left foot: Secondary | ICD-10-CM | POA: Diagnosis not present

## 2016-06-18 DIAGNOSIS — M779 Enthesopathy, unspecified: Secondary | ICD-10-CM | POA: Diagnosis not present

## 2016-07-09 DIAGNOSIS — M79672 Pain in left foot: Secondary | ICD-10-CM | POA: Diagnosis not present

## 2016-07-09 DIAGNOSIS — M79675 Pain in left toe(s): Secondary | ICD-10-CM | POA: Diagnosis not present

## 2016-07-09 DIAGNOSIS — M779 Enthesopathy, unspecified: Secondary | ICD-10-CM | POA: Diagnosis not present

## 2016-09-16 ENCOUNTER — Ambulatory Visit (INDEPENDENT_AMBULATORY_CARE_PROVIDER_SITE_OTHER): Payer: Medicare Other | Admitting: Internal Medicine

## 2016-09-17 ENCOUNTER — Encounter (INDEPENDENT_AMBULATORY_CARE_PROVIDER_SITE_OTHER): Payer: Self-pay | Admitting: Internal Medicine

## 2016-09-17 DIAGNOSIS — M19011 Primary osteoarthritis, right shoulder: Secondary | ICD-10-CM | POA: Diagnosis not present

## 2016-09-17 DIAGNOSIS — Z681 Body mass index (BMI) 19 or less, adult: Secondary | ICD-10-CM | POA: Diagnosis not present

## 2016-09-17 DIAGNOSIS — G7000 Myasthenia gravis without (acute) exacerbation: Secondary | ICD-10-CM | POA: Diagnosis not present

## 2016-09-17 DIAGNOSIS — I779 Disorder of arteries and arterioles, unspecified: Secondary | ICD-10-CM | POA: Diagnosis not present

## 2016-09-17 DIAGNOSIS — R270 Ataxia, unspecified: Secondary | ICD-10-CM | POA: Diagnosis not present

## 2016-09-17 DIAGNOSIS — R410 Disorientation, unspecified: Secondary | ICD-10-CM | POA: Diagnosis not present

## 2016-09-17 DIAGNOSIS — I10 Essential (primary) hypertension: Secondary | ICD-10-CM | POA: Diagnosis not present

## 2016-09-17 DIAGNOSIS — Z713 Dietary counseling and surveillance: Secondary | ICD-10-CM | POA: Diagnosis not present

## 2016-09-17 DIAGNOSIS — M25512 Pain in left shoulder: Secondary | ICD-10-CM | POA: Diagnosis not present

## 2016-09-17 DIAGNOSIS — Z299 Encounter for prophylactic measures, unspecified: Secondary | ICD-10-CM | POA: Diagnosis not present

## 2016-09-25 DIAGNOSIS — G319 Degenerative disease of nervous system, unspecified: Secondary | ICD-10-CM | POA: Diagnosis not present

## 2016-09-25 DIAGNOSIS — M4802 Spinal stenosis, cervical region: Secondary | ICD-10-CM | POA: Diagnosis not present

## 2016-09-25 DIAGNOSIS — R42 Dizziness and giddiness: Secondary | ICD-10-CM | POA: Diagnosis not present

## 2016-09-25 DIAGNOSIS — M47812 Spondylosis without myelopathy or radiculopathy, cervical region: Secondary | ICD-10-CM | POA: Diagnosis not present

## 2016-09-25 DIAGNOSIS — S0990XA Unspecified injury of head, initial encounter: Secondary | ICD-10-CM | POA: Diagnosis not present

## 2016-10-02 ENCOUNTER — Other Ambulatory Visit (INDEPENDENT_AMBULATORY_CARE_PROVIDER_SITE_OTHER): Payer: Self-pay | Admitting: Internal Medicine

## 2016-10-03 ENCOUNTER — Other Ambulatory Visit: Payer: Self-pay

## 2016-10-03 MED ORDER — ATORVASTATIN CALCIUM 80 MG PO TABS: 80.0000 mg | ORAL_TABLET | Freq: Every day | ORAL | 0 refills | Status: DC

## 2016-10-16 ENCOUNTER — Other Ambulatory Visit (INDEPENDENT_AMBULATORY_CARE_PROVIDER_SITE_OTHER): Payer: Self-pay | Admitting: Internal Medicine

## 2016-10-16 NOTE — Telephone Encounter (Signed)
Patient will need to get further refills from her PCP or Cardiologist.

## 2016-10-22 DIAGNOSIS — I10 Essential (primary) hypertension: Secondary | ICD-10-CM | POA: Diagnosis not present

## 2016-10-22 DIAGNOSIS — I2510 Atherosclerotic heart disease of native coronary artery without angina pectoris: Secondary | ICD-10-CM | POA: Diagnosis not present

## 2016-12-15 DIAGNOSIS — M216X2 Other acquired deformities of left foot: Secondary | ICD-10-CM | POA: Diagnosis not present

## 2016-12-15 DIAGNOSIS — M7740 Metatarsalgia, unspecified foot: Secondary | ICD-10-CM | POA: Diagnosis not present

## 2016-12-15 DIAGNOSIS — M79672 Pain in left foot: Secondary | ICD-10-CM | POA: Diagnosis not present

## 2016-12-19 ENCOUNTER — Encounter (INDEPENDENT_AMBULATORY_CARE_PROVIDER_SITE_OTHER): Payer: Self-pay | Admitting: Internal Medicine

## 2016-12-19 NOTE — Telephone Encounter (Signed)
A letter was mailed to the patient advising.

## 2017-01-07 DIAGNOSIS — I2510 Atherosclerotic heart disease of native coronary artery without angina pectoris: Secondary | ICD-10-CM | POA: Diagnosis not present

## 2017-01-07 DIAGNOSIS — I10 Essential (primary) hypertension: Secondary | ICD-10-CM | POA: Diagnosis not present

## 2017-02-11 DIAGNOSIS — Z789 Other specified health status: Secondary | ICD-10-CM | POA: Diagnosis not present

## 2017-02-11 DIAGNOSIS — I10 Essential (primary) hypertension: Secondary | ICD-10-CM | POA: Diagnosis not present

## 2017-02-11 DIAGNOSIS — Z299 Encounter for prophylactic measures, unspecified: Secondary | ICD-10-CM | POA: Diagnosis not present

## 2017-02-11 DIAGNOSIS — Z681 Body mass index (BMI) 19 or less, adult: Secondary | ICD-10-CM | POA: Diagnosis not present

## 2017-02-11 DIAGNOSIS — I2510 Atherosclerotic heart disease of native coronary artery without angina pectoris: Secondary | ICD-10-CM | POA: Diagnosis not present

## 2017-02-11 DIAGNOSIS — M79673 Pain in unspecified foot: Secondary | ICD-10-CM | POA: Diagnosis not present

## 2017-02-16 DIAGNOSIS — I2510 Atherosclerotic heart disease of native coronary artery without angina pectoris: Secondary | ICD-10-CM | POA: Diagnosis not present

## 2017-02-16 DIAGNOSIS — I10 Essential (primary) hypertension: Secondary | ICD-10-CM | POA: Diagnosis not present

## 2017-03-10 ENCOUNTER — Other Ambulatory Visit: Payer: Self-pay | Admitting: Cardiology

## 2017-03-10 ENCOUNTER — Other Ambulatory Visit (INDEPENDENT_AMBULATORY_CARE_PROVIDER_SITE_OTHER): Payer: Self-pay | Admitting: Internal Medicine

## 2017-03-10 NOTE — Telephone Encounter (Signed)
Rx(s) sent to pharmacy electronically.  

## 2017-05-07 ENCOUNTER — Other Ambulatory Visit: Payer: Self-pay | Admitting: Cardiology

## 2017-05-08 NOTE — Telephone Encounter (Signed)
REFILL 

## 2017-05-11 ENCOUNTER — Other Ambulatory Visit: Payer: Self-pay | Admitting: Cardiology

## 2017-05-30 ENCOUNTER — Other Ambulatory Visit (INDEPENDENT_AMBULATORY_CARE_PROVIDER_SITE_OTHER): Payer: Self-pay | Admitting: Internal Medicine

## 2017-06-08 ENCOUNTER — Other Ambulatory Visit: Payer: Self-pay | Admitting: Cardiology

## 2017-06-09 DIAGNOSIS — I10 Essential (primary) hypertension: Secondary | ICD-10-CM | POA: Diagnosis not present

## 2017-06-09 DIAGNOSIS — I2510 Atherosclerotic heart disease of native coronary artery without angina pectoris: Secondary | ICD-10-CM | POA: Diagnosis not present

## 2017-07-01 DIAGNOSIS — Z681 Body mass index (BMI) 19 or less, adult: Secondary | ICD-10-CM | POA: Diagnosis not present

## 2017-07-01 DIAGNOSIS — G309 Alzheimer's disease, unspecified: Secondary | ICD-10-CM | POA: Diagnosis not present

## 2017-07-01 DIAGNOSIS — R2689 Other abnormalities of gait and mobility: Secondary | ICD-10-CM | POA: Diagnosis not present

## 2017-07-01 DIAGNOSIS — F0280 Dementia in other diseases classified elsewhere without behavioral disturbance: Secondary | ICD-10-CM | POA: Diagnosis not present

## 2017-07-01 DIAGNOSIS — R5383 Other fatigue: Secondary | ICD-10-CM | POA: Diagnosis not present

## 2017-07-01 DIAGNOSIS — Z299 Encounter for prophylactic measures, unspecified: Secondary | ICD-10-CM | POA: Diagnosis not present

## 2017-07-01 DIAGNOSIS — E7800 Pure hypercholesterolemia, unspecified: Secondary | ICD-10-CM | POA: Diagnosis not present

## 2017-07-01 DIAGNOSIS — Z79899 Other long term (current) drug therapy: Secondary | ICD-10-CM | POA: Diagnosis not present

## 2017-09-10 ENCOUNTER — Encounter: Payer: Self-pay | Admitting: Neurology

## 2017-09-10 ENCOUNTER — Ambulatory Visit (INDEPENDENT_AMBULATORY_CARE_PROVIDER_SITE_OTHER): Payer: Medicare Other | Admitting: Neurology

## 2017-09-10 DIAGNOSIS — R413 Other amnesia: Secondary | ICD-10-CM | POA: Diagnosis not present

## 2017-09-10 MED ORDER — MEMANTINE HCL 5 MG PO TABS: ORAL_TABLET | ORAL | 0 refills | Status: AC

## 2017-09-10 NOTE — Progress Notes (Signed)
Faxed printed/signed rx memantine 5mg  tab qty 70 to [facility] at [phone]. Received fax confirmation.

## 2017-09-10 NOTE — Progress Notes (Signed)
Reason for visit: Memory disturbance  Referring physician: Dr. Etta Quill is a 84 y.o. female  History of present illness:  Ms. Passarelli is an 84 year old right-handed white female with a history of a progressive memory disturbance since 2014.  The patient comes in today with her grandson who is a caretaker for her.  The patient currently lives in her own home, she has aid and assistance coming in between 8 AM and 6 PM daily including weekends.  The patient spends most of the evening by herself, but her grandson has a wireless camera system set up for observation.  The patient may require some assistance with bathing, she can dress herself.  Her bowel and bladder control is fairly good.  She does not operate a motor vehicle.  She does not get outside of the house and try to wander.  She is not having hallucinations or agitation but she does have anxiety and nervousness quite a bit.  The patient has some gait instability that also has been present since 2014.  She has fallen on occasion.  She does have a walker at home but she does not use this consistently.  The patient is able to take her own medications in the evening, she gets assistance with this earlier in the day.  The patient was given a prescription for Aricept, but the family is concerned about weight loss, the patient is quite thin at this time.  The patient comes to this office for an evaluation.  Past Medical History:  Diagnosis Date  . Anemia    hx of transfusion  . Anginal pain ([place])   . Arthritis   . Cancer ([place])    colon  . Coronary artery disease    3 stents  . Depression   . GERD (gastroesophageal reflux disease)   . JTTSVXBL(390.3)     Past Surgical History:  Procedure Laterality Date  . ABDOMINAL HYSTERECTOMY    . APPENDECTOMY    . BACK SURGERY     x 2  . CARDIAC CATHETERIZATION     3 stents  . COLON SURGERY    . COLONOSCOPY N/A 10/27/2012   Procedure: COLONOSCOPY;  Surgeon: Rogene Houston, MD;   Location[facility];  Service: Endoscopy;  Laterality: N/A;  1030-rescheduled to 8:30am Ann to notify pt  . EYE SURGERY    . FRACTURE SURGERY     right wrist    Family History  Problem Relation Age of Onset  . Alzheimer's disease Mother   . CAD Father   . COPD Brother   . Congestive Heart Failure Brother     Social history:  reports that she quit smoking about 29 years ago. She has never used smokeless tobacco. She reports that she does not drink alcohol or use drugs.  Medications:  Prior to Admission medications   Medication Sig Start Date End Date Taking? Authorizing Provider  atorvastatin (LIPITOR) 80 MG tablet Take 1 tablet (80 mg total) by mouth daily at 6 PM. NEED OV. 05/08/17  Yes Minus Breeding, MD  clopidogrel (PLAVIX) 75 MG tablet Take 75 mg by mouth daily.   Yes [provider]  isosorbide mononitrate (IMDUR) 60 MG 24 hr tablet TAKE 1/2 TABLET BY MOUTH EVERY DAY 06/01/17  Yes Rehman, Mechele Dawley, MD  Liniments (SALONPAS) PADS Apply 1 each topically daily as needed (pain).   Yes [provider]  Multiple Vitamin (MULTIVITAMIN) tablet Take 1 tablet by mouth daily.   Yes  [provider]  memantine (NAMENDA) 5 MG tablet Take 1 tablet daily for one week, then take 1 tablet twice daily for one week, then take 1 tablet in the morning and 2 in the evening for one week, then take 2 tablets twice daily 09/10/17   Kathrynn Ducking, MD      Allergies  Allergen Reactions  . Celexa [Citalopram Hydrobromide]     Very irritable    ROS:  Out of a complete 14 system review of symptoms, the patient complains only of the following symptoms, and all other reviewed systems are negative.  Chest pain Hearing loss Itching Easy bruising Memory loss, confusion, weakness, slurred speech Depression, anxiety Insomnia Gait instability  Blood pressure (!) 90/50, pulse 68, height 5' (1.524 m), weight 81 lb 8 oz (37 kg), SpO2 97 %.  Physical Exam  General: The  patient is alert and cooperative at the time of the examination.  Eyes: Pupils are equal, round, and reactive to light. Discs are flat bilaterally.  Neck: The neck is supple, no carotid bruits are noted.  Respiratory: The respiratory examination is clear.  Cardiovascular: The cardiovascular examination reveals a regular rate and rhythm, no obvious murmurs or rubs are noted.  Skin: Extremities are without significant edema.  Neurologic Exam  Mental status: The patient is alert and oriented x 2 at the time of the examination (not oriented to date). The Mini-Mental status examination done today shows a total score of 11/30..  Cranial nerves: Facial symmetry is present. There is good sensation of the face to pinprick and soft touch bilaterally. The strength of the facial muscles and the muscles to head turning and shoulder shrug are normal bilaterally. Speech is well enunciated, no aphasia or dysarthria is noted. Extraocular movements are full. Visual fields are full. The tongue is midline, and the patient has symmetric elevation of the soft palate. No obvious hearing deficits are noted.  Motor: The motor testing reveals 5 over 5 strength of all 4 extremities. Good symmetric motor tone is noted throughout.  Sensory: Sensory testing is intact to pinprick, soft touch and vibration sensation on all 4 extremities. No evidence of extinction is noted.  Coordination: Cerebellar testing reveals good finger-nose-finger and heel-to-shin bilaterally.  Gait and station: Gait is minimally wide-based.  The patient is able to walk independently.  Romberg is negative. No drift is seen.  Reflexes: Deep tendon reflexes are symmetric and normal bilaterally. Toes are downgoing bilaterally.   Assessment/Plan:  1.  Progressive gait disturbance  2.  Memory disturbance  The patient has had recent blood work that reveals a normal thyroid profile and B12 level.  She will be set up for a CT scan of the brain.   She will follow-up in 6 months.  The family is concerned about maintaining her weight, the Aricept may promote weight loss.  We will start Namenda for the memory currently, the patient will call for maintenance dose prescription if she tolerates the ramp-up dose regimen.  Jill Alexanders MD 09/10/2017 2:44 PM  [facility] [address] [place], [place] [address]  Phone [phone] Fax [phone]

## 2017-09-10 NOTE — Patient Instructions (Signed)
We will start Namenda for the memory disorder.

## 2017-09-14 ENCOUNTER — Telehealth: Payer: Self-pay | Admitting: Neurology

## 2017-09-14 NOTE — Telephone Encounter (Signed)
Medicare/bcbs supp order sent to GI. No auth they will reach out to the pt to schedule.  °

## 2017-11-17 DIAGNOSIS — G7000 Myasthenia gravis without (acute) exacerbation: Secondary | ICD-10-CM | POA: Diagnosis not present

## 2017-11-17 DIAGNOSIS — Z681 Body mass index (BMI) 19 or less, adult: Secondary | ICD-10-CM | POA: Diagnosis not present

## 2017-11-17 DIAGNOSIS — I739 Peripheral vascular disease, unspecified: Secondary | ICD-10-CM | POA: Diagnosis not present

## 2017-11-17 DIAGNOSIS — Z299 Encounter for prophylactic measures, unspecified: Secondary | ICD-10-CM | POA: Diagnosis not present

## 2017-11-17 DIAGNOSIS — F0390 Unspecified dementia without behavioral disturbance: Secondary | ICD-10-CM | POA: Diagnosis not present

## 2017-11-17 DIAGNOSIS — R410 Disorientation, unspecified: Secondary | ICD-10-CM | POA: Diagnosis not present

## 2017-11-17 DIAGNOSIS — I10 Essential (primary) hypertension: Secondary | ICD-10-CM | POA: Diagnosis not present

## 2017-11-25 DIAGNOSIS — R278 Other lack of coordination: Secondary | ICD-10-CM | POA: Diagnosis not present

## 2017-11-25 DIAGNOSIS — R410 Disorientation, unspecified: Secondary | ICD-10-CM | POA: Diagnosis not present

## 2017-11-25 DIAGNOSIS — I6782 Cerebral ischemia: Secondary | ICD-10-CM | POA: Diagnosis not present

## 2017-11-25 DIAGNOSIS — G319 Degenerative disease of nervous system, unspecified: Secondary | ICD-10-CM | POA: Diagnosis not present

## 2017-11-25 DIAGNOSIS — F068 Other specified mental disorders due to known physiological condition: Secondary | ICD-10-CM | POA: Diagnosis not present

## 2017-11-25 DIAGNOSIS — R279 Unspecified lack of coordination: Secondary | ICD-10-CM | POA: Diagnosis not present

## 2017-11-25 DIAGNOSIS — M47812 Spondylosis without myelopathy or radiculopathy, cervical region: Secondary | ICD-10-CM | POA: Diagnosis not present

## 2018-02-16 DIAGNOSIS — Z681 Body mass index (BMI) 19 or less, adult: Secondary | ICD-10-CM | POA: Diagnosis not present

## 2018-02-16 DIAGNOSIS — F419 Anxiety disorder, unspecified: Secondary | ICD-10-CM | POA: Diagnosis not present

## 2018-02-16 DIAGNOSIS — Z299 Encounter for prophylactic measures, unspecified: Secondary | ICD-10-CM | POA: Diagnosis not present

## 2018-02-16 DIAGNOSIS — F0390 Unspecified dementia without behavioral disturbance: Secondary | ICD-10-CM | POA: Diagnosis not present

## 2018-02-16 DIAGNOSIS — G7000 Myasthenia gravis without (acute) exacerbation: Secondary | ICD-10-CM | POA: Diagnosis not present

## 2018-02-16 DIAGNOSIS — I10 Essential (primary) hypertension: Secondary | ICD-10-CM | POA: Diagnosis not present

## 2018-03-16 DIAGNOSIS — R634 Abnormal weight loss: Secondary | ICD-10-CM | POA: Diagnosis not present

## 2018-03-16 DIAGNOSIS — F419 Anxiety disorder, unspecified: Secondary | ICD-10-CM | POA: Diagnosis not present

## 2018-03-16 DIAGNOSIS — Z681 Body mass index (BMI) 19 or less, adult: Secondary | ICD-10-CM | POA: Diagnosis not present

## 2018-03-16 DIAGNOSIS — F0280 Dementia in other diseases classified elsewhere without behavioral disturbance: Secondary | ICD-10-CM | POA: Diagnosis not present

## 2018-03-16 DIAGNOSIS — G309 Alzheimer's disease, unspecified: Secondary | ICD-10-CM | POA: Diagnosis not present

## 2018-03-16 DIAGNOSIS — Z299 Encounter for prophylactic measures, unspecified: Secondary | ICD-10-CM | POA: Diagnosis not present

## 2018-03-16 DIAGNOSIS — I10 Essential (primary) hypertension: Secondary | ICD-10-CM | POA: Diagnosis not present

## 2018-03-23 ENCOUNTER — Other Ambulatory Visit (INDEPENDENT_AMBULATORY_CARE_PROVIDER_SITE_OTHER): Payer: Self-pay | Admitting: Internal Medicine

## 2018-04-01 ENCOUNTER — Ambulatory Visit: Payer: Medicare Other | Admitting: Neurology

## 2018-04-15 DIAGNOSIS — G309 Alzheimer's disease, unspecified: Secondary | ICD-10-CM | POA: Diagnosis not present

## 2018-04-15 DIAGNOSIS — F0280 Dementia in other diseases classified elsewhere without behavioral disturbance: Secondary | ICD-10-CM | POA: Diagnosis not present

## 2018-04-15 DIAGNOSIS — L309 Dermatitis, unspecified: Secondary | ICD-10-CM | POA: Diagnosis not present

## 2018-04-15 DIAGNOSIS — Z681 Body mass index (BMI) 19 or less, adult: Secondary | ICD-10-CM | POA: Diagnosis not present

## 2018-04-15 DIAGNOSIS — F0390 Unspecified dementia without behavioral disturbance: Secondary | ICD-10-CM | POA: Diagnosis not present

## 2018-04-15 DIAGNOSIS — I10 Essential (primary) hypertension: Secondary | ICD-10-CM | POA: Diagnosis not present

## 2018-04-15 DIAGNOSIS — Z299 Encounter for prophylactic measures, unspecified: Secondary | ICD-10-CM | POA: Diagnosis not present

## 2018-05-24 DIAGNOSIS — Z710 Person encountering health services to consult on behalf of another person: Secondary | ICD-10-CM | POA: Diagnosis not present

## 2018-05-24 DIAGNOSIS — G309 Alzheimer's disease, unspecified: Secondary | ICD-10-CM | POA: Diagnosis not present

## 2018-05-24 DIAGNOSIS — F0280 Dementia in other diseases classified elsewhere without behavioral disturbance: Secondary | ICD-10-CM | POA: Diagnosis not present

## 2018-05-25 DIAGNOSIS — E7800 Pure hypercholesterolemia, unspecified: Secondary | ICD-10-CM | POA: Diagnosis not present

## 2018-05-25 DIAGNOSIS — F0280 Dementia in other diseases classified elsewhere without behavioral disturbance: Secondary | ICD-10-CM | POA: Diagnosis not present

## 2018-05-25 DIAGNOSIS — Z299 Encounter for prophylactic measures, unspecified: Secondary | ICD-10-CM | POA: Diagnosis not present

## 2018-05-25 DIAGNOSIS — Z681 Body mass index (BMI) 19 or less, adult: Secondary | ICD-10-CM | POA: Diagnosis not present

## 2018-05-25 DIAGNOSIS — Z1339 Encounter for screening examination for other mental health and behavioral disorders: Secondary | ICD-10-CM | POA: Diagnosis not present

## 2018-05-25 DIAGNOSIS — Z0000 Encounter for general adult medical examination without abnormal findings: Secondary | ICD-10-CM | POA: Diagnosis not present

## 2018-05-25 DIAGNOSIS — I10 Essential (primary) hypertension: Secondary | ICD-10-CM | POA: Diagnosis not present

## 2018-05-25 DIAGNOSIS — R5383 Other fatigue: Secondary | ICD-10-CM | POA: Diagnosis not present

## 2018-05-25 DIAGNOSIS — Z1331 Encounter for screening for depression: Secondary | ICD-10-CM | POA: Diagnosis not present

## 2018-05-25 DIAGNOSIS — Z7189 Other specified counseling: Secondary | ICD-10-CM | POA: Diagnosis not present

## 2018-05-25 DIAGNOSIS — G309 Alzheimer's disease, unspecified: Secondary | ICD-10-CM | POA: Diagnosis not present

## 2018-05-29 DIAGNOSIS — R4781 Slurred speech: Secondary | ICD-10-CM | POA: Diagnosis not present

## 2018-05-29 DIAGNOSIS — I2510 Atherosclerotic heart disease of native coronary artery without angina pectoris: Secondary | ICD-10-CM | POA: Diagnosis present

## 2018-05-29 DIAGNOSIS — Z681 Body mass index (BMI) 19 or less, adult: Secondary | ICD-10-CM | POA: Diagnosis not present

## 2018-05-29 DIAGNOSIS — R404 Transient alteration of awareness: Secondary | ICD-10-CM | POA: Diagnosis not present

## 2018-05-29 DIAGNOSIS — E7800 Pure hypercholesterolemia, unspecified: Secondary | ICD-10-CM | POA: Diagnosis present

## 2018-05-29 DIAGNOSIS — Z85038 Personal history of other malignant neoplasm of large intestine: Secondary | ICD-10-CM | POA: Diagnosis not present

## 2018-05-29 DIAGNOSIS — Z7401 Bed confinement status: Secondary | ICD-10-CM | POA: Diagnosis not present

## 2018-05-29 DIAGNOSIS — F411 Generalized anxiety disorder: Secondary | ICD-10-CM | POA: Diagnosis not present

## 2018-05-29 DIAGNOSIS — R531 Weakness: Secondary | ICD-10-CM | POA: Diagnosis not present

## 2018-05-29 DIAGNOSIS — E785 Hyperlipidemia, unspecified: Secondary | ICD-10-CM | POA: Diagnosis not present

## 2018-05-29 DIAGNOSIS — Z87440 Personal history of urinary (tract) infections: Secondary | ICD-10-CM | POA: Diagnosis not present

## 2018-05-29 DIAGNOSIS — Z1159 Encounter for screening for other viral diseases: Secondary | ICD-10-CM | POA: Diagnosis not present

## 2018-05-29 DIAGNOSIS — E43 Unspecified severe protein-calorie malnutrition: Secondary | ICD-10-CM | POA: Diagnosis not present

## 2018-05-29 DIAGNOSIS — N390 Urinary tract infection, site not specified: Secondary | ICD-10-CM | POA: Diagnosis not present

## 2018-05-29 DIAGNOSIS — B965 Pseudomonas (aeruginosa) (mallei) (pseudomallei) as the cause of diseases classified elsewhere: Secondary | ICD-10-CM | POA: Diagnosis present

## 2018-05-29 DIAGNOSIS — M13841 Other specified arthritis, right hand: Secondary | ICD-10-CM | POA: Diagnosis not present

## 2018-05-29 DIAGNOSIS — J984 Other disorders of lung: Secondary | ICD-10-CM | POA: Diagnosis not present

## 2018-05-29 DIAGNOSIS — I10 Essential (primary) hypertension: Secondary | ICD-10-CM | POA: Diagnosis present

## 2018-05-29 DIAGNOSIS — F0280 Dementia in other diseases classified elsewhere without behavioral disturbance: Secondary | ICD-10-CM | POA: Diagnosis not present

## 2018-05-29 DIAGNOSIS — R410 Disorientation, unspecified: Secondary | ICD-10-CM | POA: Diagnosis not present

## 2018-05-29 DIAGNOSIS — G309 Alzheimer's disease, unspecified: Secondary | ICD-10-CM | POA: Diagnosis not present

## 2018-05-29 DIAGNOSIS — Z7902 Long term (current) use of antithrombotics/antiplatelets: Secondary | ICD-10-CM | POA: Diagnosis not present

## 2018-05-29 DIAGNOSIS — R4182 Altered mental status, unspecified: Secondary | ICD-10-CM | POA: Diagnosis not present

## 2018-05-29 DIAGNOSIS — Z955 Presence of coronary angioplasty implant and graft: Secondary | ICD-10-CM | POA: Diagnosis not present

## 2018-05-29 DIAGNOSIS — R5381 Other malaise: Secondary | ICD-10-CM | POA: Diagnosis not present

## 2018-05-29 DIAGNOSIS — I959 Hypotension, unspecified: Secondary | ICD-10-CM | POA: Diagnosis not present

## 2018-05-29 DIAGNOSIS — F330 Major depressive disorder, recurrent, mild: Secondary | ICD-10-CM | POA: Diagnosis not present

## 2018-05-29 DIAGNOSIS — H5461 Unqualified visual loss, right eye, normal vision left eye: Secondary | ICD-10-CM | POA: Diagnosis not present

## 2018-05-29 DIAGNOSIS — F329 Major depressive disorder, single episode, unspecified: Secondary | ICD-10-CM | POA: Diagnosis present

## 2018-06-04 DIAGNOSIS — S61402A Unspecified open wound of left hand, initial encounter: Secondary | ICD-10-CM | POA: Diagnosis not present

## 2018-06-04 DIAGNOSIS — F411 Generalized anxiety disorder: Secondary | ICD-10-CM | POA: Diagnosis not present

## 2018-06-04 DIAGNOSIS — R404 Transient alteration of awareness: Secondary | ICD-10-CM | POA: Diagnosis not present

## 2018-06-04 DIAGNOSIS — R531 Weakness: Secondary | ICD-10-CM | POA: Diagnosis not present

## 2018-06-04 DIAGNOSIS — S4992XA Unspecified injury of left shoulder and upper arm, initial encounter: Secondary | ICD-10-CM | POA: Diagnosis not present

## 2018-06-04 DIAGNOSIS — Z79899 Other long term (current) drug therapy: Secondary | ICD-10-CM | POA: Diagnosis not present

## 2018-06-04 DIAGNOSIS — S199XXA Unspecified injury of neck, initial encounter: Secondary | ICD-10-CM | POA: Diagnosis not present

## 2018-06-04 DIAGNOSIS — R279 Unspecified lack of coordination: Secondary | ICD-10-CM | POA: Diagnosis not present

## 2018-06-04 DIAGNOSIS — F0391 Unspecified dementia with behavioral disturbance: Secondary | ICD-10-CM | POA: Diagnosis not present

## 2018-06-04 DIAGNOSIS — M79642 Pain in left hand: Secondary | ICD-10-CM | POA: Diagnosis not present

## 2018-06-04 DIAGNOSIS — I10 Essential (primary) hypertension: Secondary | ICD-10-CM | POA: Diagnosis not present

## 2018-06-04 DIAGNOSIS — F0390 Unspecified dementia without behavioral disturbance: Secondary | ICD-10-CM | POA: Diagnosis not present

## 2018-06-04 DIAGNOSIS — W19XXXA Unspecified fall, initial encounter: Secondary | ICD-10-CM | POA: Diagnosis not present

## 2018-06-04 DIAGNOSIS — M4802 Spinal stenosis, cervical region: Secondary | ICD-10-CM | POA: Diagnosis not present

## 2018-06-04 DIAGNOSIS — R220 Localized swelling, mass and lump, head: Secondary | ICD-10-CM | POA: Diagnosis not present

## 2018-06-04 DIAGNOSIS — R52 Pain, unspecified: Secondary | ICD-10-CM | POA: Diagnosis not present

## 2018-06-04 DIAGNOSIS — I2510 Atherosclerotic heart disease of native coronary artery without angina pectoris: Secondary | ICD-10-CM | POA: Diagnosis not present

## 2018-06-04 DIAGNOSIS — S0181XA Laceration without foreign body of other part of head, initial encounter: Secondary | ICD-10-CM | POA: Diagnosis not present

## 2018-06-04 DIAGNOSIS — E43 Unspecified severe protein-calorie malnutrition: Secondary | ICD-10-CM | POA: Diagnosis not present

## 2018-06-04 DIAGNOSIS — M25512 Pain in left shoulder: Secondary | ICD-10-CM | POA: Diagnosis not present

## 2018-06-04 DIAGNOSIS — I959 Hypotension, unspecified: Secondary | ICD-10-CM | POA: Diagnosis not present

## 2018-06-04 DIAGNOSIS — Z20828 Contact with and (suspected) exposure to other viral communicable diseases: Secondary | ICD-10-CM | POA: Diagnosis not present

## 2018-06-04 DIAGNOSIS — F322 Major depressive disorder, single episode, severe without psychotic features: Secondary | ICD-10-CM | POA: Diagnosis not present

## 2018-06-04 DIAGNOSIS — S0083XA Contusion of other part of head, initial encounter: Secondary | ICD-10-CM | POA: Diagnosis not present

## 2018-06-04 DIAGNOSIS — Z743 Need for continuous supervision: Secondary | ICD-10-CM | POA: Diagnosis not present

## 2018-06-04 DIAGNOSIS — F329 Major depressive disorder, single episode, unspecified: Secondary | ICD-10-CM | POA: Diagnosis not present

## 2018-06-04 DIAGNOSIS — W1830XA Fall on same level, unspecified, initial encounter: Secondary | ICD-10-CM | POA: Diagnosis not present

## 2018-06-04 DIAGNOSIS — Z7401 Bed confinement status: Secondary | ICD-10-CM | POA: Diagnosis not present

## 2018-06-04 DIAGNOSIS — R4182 Altered mental status, unspecified: Secondary | ICD-10-CM | POA: Diagnosis not present

## 2018-06-04 DIAGNOSIS — E785 Hyperlipidemia, unspecified: Secondary | ICD-10-CM | POA: Diagnosis not present

## 2018-06-04 DIAGNOSIS — M13841 Other specified arthritis, right hand: Secondary | ICD-10-CM | POA: Diagnosis not present

## 2018-06-04 DIAGNOSIS — G309 Alzheimer's disease, unspecified: Secondary | ICD-10-CM | POA: Diagnosis not present

## 2018-06-04 DIAGNOSIS — F330 Major depressive disorder, recurrent, mild: Secondary | ICD-10-CM | POA: Diagnosis not present

## 2018-06-04 DIAGNOSIS — S6992XA Unspecified injury of left wrist, hand and finger(s), initial encounter: Secondary | ICD-10-CM | POA: Diagnosis not present

## 2018-06-04 DIAGNOSIS — H5461 Unqualified visual loss, right eye, normal vision left eye: Secondary | ICD-10-CM | POA: Diagnosis not present

## 2018-06-04 DIAGNOSIS — S61412A Laceration without foreign body of left hand, initial encounter: Secondary | ICD-10-CM | POA: Diagnosis not present

## 2018-06-04 DIAGNOSIS — Z1383 Encounter for screening for respiratory disorder NEC: Secondary | ICD-10-CM | POA: Diagnosis not present

## 2018-06-04 DIAGNOSIS — Y999 Unspecified external cause status: Secondary | ICD-10-CM | POA: Diagnosis not present

## 2018-06-04 DIAGNOSIS — R0902 Hypoxemia: Secondary | ICD-10-CM | POA: Diagnosis not present

## 2018-06-04 DIAGNOSIS — Z85038 Personal history of other malignant neoplasm of large intestine: Secondary | ICD-10-CM | POA: Diagnosis not present

## 2018-06-04 DIAGNOSIS — Z681 Body mass index (BMI) 19 or less, adult: Secondary | ICD-10-CM | POA: Diagnosis not present

## 2018-06-04 DIAGNOSIS — N390 Urinary tract infection, site not specified: Secondary | ICD-10-CM | POA: Diagnosis not present

## 2018-06-04 DIAGNOSIS — Y939 Activity, unspecified: Secondary | ICD-10-CM | POA: Diagnosis not present

## 2018-06-04 DIAGNOSIS — S0993XA Unspecified injury of face, initial encounter: Secondary | ICD-10-CM | POA: Diagnosis not present

## 2018-06-04 DIAGNOSIS — S01112A Laceration without foreign body of left eyelid and periocular area, initial encounter: Secondary | ICD-10-CM | POA: Diagnosis not present

## 2018-06-04 DIAGNOSIS — S0990XA Unspecified injury of head, initial encounter: Secondary | ICD-10-CM | POA: Diagnosis not present

## 2018-06-04 DIAGNOSIS — Y92122 Bedroom in nursing home as the place of occurrence of the external cause: Secondary | ICD-10-CM | POA: Diagnosis not present

## 2018-06-04 DIAGNOSIS — Z23 Encounter for immunization: Secondary | ICD-10-CM | POA: Diagnosis not present

## 2018-06-07 DIAGNOSIS — Z20828 Contact with and (suspected) exposure to other viral communicable diseases: Secondary | ICD-10-CM | POA: Diagnosis not present

## 2018-06-07 DIAGNOSIS — G309 Alzheimer's disease, unspecified: Secondary | ICD-10-CM | POA: Diagnosis not present

## 2018-06-07 DIAGNOSIS — F322 Major depressive disorder, single episode, severe without psychotic features: Secondary | ICD-10-CM | POA: Diagnosis not present

## 2018-06-07 DIAGNOSIS — I2510 Atherosclerotic heart disease of native coronary artery without angina pectoris: Secondary | ICD-10-CM | POA: Diagnosis not present

## 2018-06-07 DIAGNOSIS — R531 Weakness: Secondary | ICD-10-CM | POA: Diagnosis not present

## 2018-06-07 DIAGNOSIS — N390 Urinary tract infection, site not specified: Secondary | ICD-10-CM | POA: Diagnosis not present

## 2018-06-09 ENCOUNTER — Emergency Department (HOSPITAL_COMMUNITY): Payer: Medicare Other

## 2018-06-09 ENCOUNTER — Emergency Department (HOSPITAL_COMMUNITY)
Admission: EM | Admit: 2018-06-09 | Discharge: 2018-06-09 | Disposition: A | Discharge status: Home / Self Care | Payer: Medicare Other | Attending: Emergency Medicine | Admitting: Emergency Medicine

## 2018-06-09 ENCOUNTER — Encounter (HOSPITAL_COMMUNITY): Payer: Self-pay | Admitting: Emergency Medicine

## 2018-06-09 DIAGNOSIS — M25512 Pain in left shoulder: Secondary | ICD-10-CM | POA: Diagnosis not present

## 2018-06-09 DIAGNOSIS — S0990XA Unspecified injury of head, initial encounter: Secondary | ICD-10-CM | POA: Diagnosis not present

## 2018-06-09 DIAGNOSIS — F0390 Unspecified dementia without behavioral disturbance: Secondary | ICD-10-CM | POA: Insufficient documentation

## 2018-06-09 DIAGNOSIS — M79642 Pain in left hand: Secondary | ICD-10-CM | POA: Diagnosis not present

## 2018-06-09 DIAGNOSIS — I2510 Atherosclerotic heart disease of native coronary artery without angina pectoris: Secondary | ICD-10-CM | POA: Diagnosis not present

## 2018-06-09 DIAGNOSIS — S01112A Laceration without foreign body of left eyelid and periocular area, initial encounter: Secondary | ICD-10-CM | POA: Diagnosis not present

## 2018-06-09 DIAGNOSIS — S0181XA Laceration without foreign body of other part of head, initial encounter: Secondary | ICD-10-CM

## 2018-06-09 DIAGNOSIS — Y939 Activity, unspecified: Secondary | ICD-10-CM | POA: Insufficient documentation

## 2018-06-09 DIAGNOSIS — Z85038 Personal history of other malignant neoplasm of large intestine: Secondary | ICD-10-CM | POA: Insufficient documentation

## 2018-06-09 DIAGNOSIS — S0083XA Contusion of other part of head, initial encounter: Secondary | ICD-10-CM

## 2018-06-09 DIAGNOSIS — S4992XA Unspecified injury of left shoulder and upper arm, initial encounter: Secondary | ICD-10-CM | POA: Diagnosis not present

## 2018-06-09 DIAGNOSIS — Z23 Encounter for immunization: Secondary | ICD-10-CM | POA: Insufficient documentation

## 2018-06-09 DIAGNOSIS — W1830XA Fall on same level, unspecified, initial encounter: Secondary | ICD-10-CM | POA: Insufficient documentation

## 2018-06-09 DIAGNOSIS — Y92122 Bedroom in nursing home as the place of occurrence of the external cause: Secondary | ICD-10-CM | POA: Insufficient documentation

## 2018-06-09 DIAGNOSIS — S6992XA Unspecified injury of left wrist, hand and finger(s), initial encounter: Secondary | ICD-10-CM | POA: Diagnosis not present

## 2018-06-09 DIAGNOSIS — Y999 Unspecified external cause status: Secondary | ICD-10-CM | POA: Insufficient documentation

## 2018-06-09 DIAGNOSIS — Z79899 Other long term (current) drug therapy: Secondary | ICD-10-CM | POA: Insufficient documentation

## 2018-06-09 DIAGNOSIS — M4802 Spinal stenosis, cervical region: Secondary | ICD-10-CM | POA: Diagnosis not present

## 2018-06-09 DIAGNOSIS — S199XXA Unspecified injury of neck, initial encounter: Secondary | ICD-10-CM | POA: Diagnosis not present

## 2018-06-09 DIAGNOSIS — S61402A Unspecified open wound of left hand, initial encounter: Secondary | ICD-10-CM | POA: Diagnosis not present

## 2018-06-09 DIAGNOSIS — R220 Localized swelling, mass and lump, head: Secondary | ICD-10-CM | POA: Diagnosis not present

## 2018-06-09 DIAGNOSIS — S61412A Laceration without foreign body of left hand, initial encounter: Secondary | ICD-10-CM | POA: Diagnosis not present

## 2018-06-09 DIAGNOSIS — S0993XA Unspecified injury of face, initial encounter: Secondary | ICD-10-CM | POA: Diagnosis not present

## 2018-06-09 DIAGNOSIS — W19XXXA Unspecified fall, initial encounter: Secondary | ICD-10-CM

## 2018-06-09 MED ORDER — DICLOFENAC SODIUM 1 % TD GEL: 2.0000 g | Freq: Two times a day (BID) | TRANSDERMAL | 1 refills | Status: AC | PRN

## 2018-06-09 MED ORDER — LIDOCAINE-EPINEPHRINE (PF) 2 %-1:200000 IJ SOLN
10.0000 mL | Freq: Once | INTRAMUSCULAR | Status: AC
  Administered 2018-06-09: 10 mL
  Filled 2018-06-09: qty 20

## 2018-06-09 MED ORDER — TETANUS-DIPHTH-ACELL PERTUSSIS 5-2.5-18.5 LF-MCG/0.5 IM SUSP
0.5000 mL | Freq: Once | INTRAMUSCULAR | Status: AC
  Administered 2018-06-09: 08:00:00 0.5 mL via INTRAMUSCULAR
  Filled 2018-06-09 (×2): qty 0.5

## 2018-06-09 NOTE — ED Notes (Signed)
Called ptar 

## 2018-06-09 NOTE — ED Notes (Signed)
Attempted to give report to [facility]. Facility unable to take report, patient discharge instructions given to PTAR. Report given to PTAR. Pt discharged from ED without incident. MD spoke with patient's grandson.

## 2018-06-09 NOTE — ED Triage Notes (Signed)
BIB EMS from [facility]. Pt had unwitnessed fall, was found on the floor beside her bed. Pt presents with laceration to L eye lid, L eye is bruised and swollen shut. Also has skin tear to L hand. Reports pain to L shoulder but no obvious deformity. Pt is pleasantly demented, oriented to self.

## 2018-06-09 NOTE — Discharge Instructions (Signed)
You were seen in the emergency department for evaluation of injuries from a fall.  You had a CAT scan of your head face and cervical spine along with x-rays of your left shoulder and left hand.  There was evidence of spinal stenosis in your cervical spine, but neurosurgery did not think you would be a good surgical candidate.  Soft collar as needed for comfort.  The sutures over your left eye will need to be removed in 5 days.  There was also a skin tear in the left hand.  You should use ice to the affected areas.  Return if any concerns.

## 2018-06-09 NOTE — ED Provider Notes (Signed)
[facility] EMERGENCY DEPARTMENT Provider Note   CSN: [id number] Arrival date & time: 06/09/18  1448    History   Chief Complaint Chief Complaint  Patient presents with   Fall    HPI Alexandria Wilson is a 85 y.o. female.  Level 5 caveat secondary to dementia.  85 year old female resident of [facility] brought in by EMS after an unwitnessed fall.  She was found by staff beside the bed on the floor.  She is unable to give any history.  Per EMS staff said that she was at her baseline.     The history is provided by the EMS personnel.  Fall  This is a new problem. The current episode started 1 to 2 hours ago. The problem has not changed since onset.Exacerbated by: palpation. The symptoms are relieved by position. She has tried nothing for the symptoms. The treatment provided no relief.    Past Medical History:  Diagnosis Date   Anemia    hx of transfusion   Anginal pain ([place])    Arthritis    Cancer ([place])    colon   Coronary artery disease    3 stents   Depression    GERD (gastroesophageal reflux disease)    Headache(784.0)     Patient Active Problem List   Diagnosis Date Noted   Loss of weight 01/08/2015   Weakness 01/08/2015   Stress disorder, acute 01/08/2015   CAD (coronary artery disease) 01/08/2015   History of colonic polyps 01/08/2015   History of colon cancer 01/08/2015    Past Surgical History:  Procedure Laterality Date   ABDOMINAL HYSTERECTOMY     APPENDECTOMY     BACK SURGERY     x 2   CARDIAC CATHETERIZATION     3 stents   COLON SURGERY     COLONOSCOPY N/A 10/27/2012   Procedure: COLONOSCOPY;  Surgeon: Rogene Houston, MD;  Location: [facility];  Service: Endoscopy;  Laterality: N/A;  1030-rescheduled to 8:30am Ann to notify pt   EYE SURGERY     FRACTURE SURGERY     right wrist     OB History   No obstetric history on file.      Home Medications    Prior to Admission medications   Medication  Sig Start Date End Date Taking? Authorizing Provider  atorvastatin (LIPITOR) 80 MG tablet Take 1 tablet (80 mg total) by mouth daily at 6 PM. NEED OV. 05/08/17   Minus Breeding, MD  clopidogrel (PLAVIX) 75 MG tablet Take 75 mg by mouth daily.    [provider]  isosorbide mononitrate (IMDUR) 60 MG 24 hr tablet TAKE 1/2 TABLET BY MOUTH EVERY DAY 06/01/17   Rehman, Mechele Dawley, MD  [facility]) PADS Apply 1 each topically daily as needed (pain).    [provider]  memantine (NAMENDA) 5 MG tablet Take 1 tablet daily for one week, then take 1 tablet twice daily for one week, then take 1 tablet in the morning and 2 in the evening for one week, then take 2 tablets twice daily 09/10/17   Kathrynn Ducking, MD  Multiple Vitamin (MULTIVITAMIN) tablet Take 1 tablet by mouth daily.    [provider]    Family History Family History  Problem Relation Age of Onset   Alzheimer's disease Mother    CAD Father    COPD Brother    Congestive Heart Failure Brother     Social History Social History  Tobacco Use   Smoking status: Former Smoker    Last attempt to quit: 01/21/1988    Years since quitting: 30.4   Smokeless tobacco: Never Used  Substance Use Topics   Alcohol use: No    Alcohol/week: 0.0 standard drinks   Drug use: No     Allergies   Celexa [citalopram hydrobromide]   Review of Systems Review of Systems  Unable to perform ROS: Dementia     Physical Exam Updated Vital Signs BP (!) 173/57 (BP Location: Right Arm)    Pulse 67    Temp 98.9 F (37.2 C) (Oral)    Resp 17    Ht 5\' 2"  (1.575 m)    Wt 40.8 kg    SpO2 97%    BMI 16.46 kg/m   Physical Exam Vitals signs and nursing note reviewed.  Constitutional:      General: She is not in acute distress.    Appearance: She is well-developed.  HENT:     Head: Normocephalic.     Comments: She has bruising over her left superior orbital ridge and left eyelid with a 3 cm laceration over the eye  lid.  She has some dried blood around her mouth. Eyes:     Conjunctiva/sclera: Conjunctivae normal.  Neck:     Musculoskeletal: No muscular tenderness.     Comments: C-collar in place.  Midline.  No Stridor Cardiovascular:     Rate and Rhythm: Normal rate and regular rhythm.     Heart sounds: No murmur.  Pulmonary:     Effort: Pulmonary effort is normal. No respiratory distress.     Breath sounds: Normal breath sounds.  Abdominal:     Palpations: Abdomen is soft.     Tenderness: There is no abdominal tenderness. There is no guarding.  Musculoskeletal: Normal range of motion.        General: Tenderness present.     Comments: She is some tenderness of her left upper arm but no obvious deformity.  She also has some bruising and skin tear on the dorsum of her left hand approximately 3 cm.  She is full range of motion of her right upper extremity and bilateral lower extremities without any limitations or pain.  Skin:    General: Skin is warm and dry.     Capillary Refill: Capillary refill takes less than 2 seconds.  Neurological:     General: No focal deficit present.     Mental Status: She is alert.     Comments: Patient is awake and oriented to self only.  She will sometimes follow commands.  She has no focal deficit.      ED Treatments / Results  Labs (all labs ordered are listed, but only abnormal results are displayed) Labs Reviewed - No data to display  EKG None  Radiology Ct Head Wo Contrast  Result Date: 06/09/2018 CLINICAL DATA:  Recent fall with left-sided facial injury, initial encounter EXAM: CT HEAD WITHOUT CONTRAST CT MAXILLOFACIAL WITHOUT CONTRAST CT CERVICAL SPINE WITHOUT CONTRAST TECHNIQUE: Multidetector CT imaging of the head, cervical spine, and maxillofacial structures were performed using the standard protocol without intravenous contrast. Multiplanar CT image reconstructions of the cervical spine and maxillofacial structures were also generated. COMPARISON:   07/25/2013. FINDINGS: CT HEAD FINDINGS Brain: Mild atrophic changes and chronic white matter ischemic changes are seen. No findings to suggest acute hemorrhage, acute infarction or space-occupying mass lesion are noted. Vascular: No hyperdense vessel or unexpected calcification. Skull: The bony calvarium is intact.  No acute fracture is noted. A chronic right zygomatic arch fracture is seen and stable from the prior exam. Other: Soft tissue swelling is noted in the left periorbital region laterally small amount of subcutaneous air is noted. CT MAXILLOFACIAL FINDINGS Osseous: Depressed zygomatic arch fracture is noted on the right with healing stable from the previous exam. Left zygomatic arch is within normal limits. No acute fracture is noted. No orbital blowout fracture is noted. Orbits: The orbits and their contents are within normal limits. Periorbital swelling is noted on the left consistent with the recent injury. A small amount of subcutaneous air is seen along the supraorbital ridge laterally. Sinuses: Paranasal sinuses are within normal limits. Soft tissues: Periorbital swelling on the left as described above. No other soft tissue abnormality is seen. CT CERVICAL SPINE FINDINGS Alignment: Loss of the normal cervical lordosis is noted Skull base and vertebrae: 7 cervical segments are well visualized. Considerable pannus formation is noted posterior to the C1-C2 articulation stable from multiple previous exams. The base of the odontoid demonstrates fracture although both fragments are well corticated and this is felt to be chronic in nature with nonunion. These changes are likely related to pannus erosion. These changes are new from the prior exam. Multilevel disc space narrowing with osteophytic changes are seen. Multilevel facet hypertrophic changes are noted. No acute fracture or acute facet abnormality is seen. Soft tissues and spinal canal: Chronic calcifications are identified. Narrowing of the cervical  cord is noted at the craniocervical junction related to the exuberant pannus seen. The possibility motion at the nonunion deserves consideration. Upper chest: Within normal limits. Other: None IMPRESSION: CT of the head: No acute intracranial abnormality noted. Atrophic and chronic white matter ischemic changes are again seen. Left periorbital soft tissue swelling related to the recent injury. CT of the maxillofacial bones: Left periorbital soft tissue swelling without acute underlying bony abnormality. Chronic right zygomatic arch fracture. CT of the cervical spine: Considerable pain as formation at C1-C2 with erosion into the base of the odontoid and subsequent fracture. This is new from 2015 but due to the well corticated fragments appears to be chronic in nature with nonunion. The pannus causes some central canal stenosis and impingement of the cervical cord. Although not evaluated on this exam it is possible there is some abnormal motion at the area of nonunion. Neurosurgical consultation is recommended for further evaluation. Multilevel degenerative change without acute abnormality. Critical Value/emergent results were called by telephone at the time of interpretation on 06/09/2018 at 8:26 am to Dr. Aletta Edouard , who verbally acknowledged these results. Electronically Signed   By: Inez Catalina M.D.   On: 06/09/2018 08:26   Ct Cervical Spine Wo Contrast  Result Date: 06/09/2018 CLINICAL DATA:  Recent fall with left-sided facial injury, initial encounter EXAM: CT HEAD WITHOUT CONTRAST CT MAXILLOFACIAL WITHOUT CONTRAST CT CERVICAL SPINE WITHOUT CONTRAST TECHNIQUE: Multidetector CT imaging of the head, cervical spine, and maxillofacial structures were performed using the standard protocol without intravenous contrast. Multiplanar CT image reconstructions of the cervical spine and maxillofacial structures were also generated. COMPARISON:  07/25/2013. FINDINGS: CT HEAD FINDINGS Brain: Mild atrophic changes and  chronic white matter ischemic changes are seen. No findings to suggest acute hemorrhage, acute infarction or space-occupying mass lesion are noted. Vascular: No hyperdense vessel or unexpected calcification. Skull: The bony calvarium is intact. No acute fracture is noted. A chronic right zygomatic arch fracture is seen and stable from the prior exam. Other: Soft tissue swelling is noted  in the left periorbital region laterally small amount of subcutaneous air is noted. CT MAXILLOFACIAL FINDINGS Osseous: Depressed zygomatic arch fracture is noted on the right with healing stable from the previous exam. Left zygomatic arch is within normal limits. No acute fracture is noted. No orbital blowout fracture is noted. Orbits: The orbits and their contents are within normal limits. Periorbital swelling is noted on the left consistent with the recent injury. A small amount of subcutaneous air is seen along the supraorbital ridge laterally. Sinuses: Paranasal sinuses are within normal limits. Soft tissues: Periorbital swelling on the left as described above. No other soft tissue abnormality is seen. CT CERVICAL SPINE FINDINGS Alignment: Loss of the normal cervical lordosis is noted Skull base and vertebrae: 7 cervical segments are well visualized. Considerable pannus formation is noted posterior to the C1-C2 articulation stable from multiple previous exams. The base of the odontoid demonstrates fracture although both fragments are well corticated and this is felt to be chronic in nature with nonunion. These changes are likely related to pannus erosion. These changes are new from the prior exam. Multilevel disc space narrowing with osteophytic changes are seen. Multilevel facet hypertrophic changes are noted. No acute fracture or acute facet abnormality is seen. Soft tissues and spinal canal: Chronic calcifications are identified. Narrowing of the cervical cord is noted at the craniocervical junction related to the exuberant  pannus seen. The possibility motion at the nonunion deserves consideration. Upper chest: Within normal limits. Other: None IMPRESSION: CT of the head: No acute intracranial abnormality noted. Atrophic and chronic white matter ischemic changes are again seen. Left periorbital soft tissue swelling related to the recent injury. CT of the maxillofacial bones: Left periorbital soft tissue swelling without acute underlying bony abnormality. Chronic right zygomatic arch fracture. CT of the cervical spine: Considerable pain as formation at C1-C2 with erosion into the base of the odontoid and subsequent fracture. This is new from 2015 but due to the well corticated fragments appears to be chronic in nature with nonunion. The pannus causes some central canal stenosis and impingement of the cervical cord. Although not evaluated on this exam it is possible there is some abnormal motion at the area of nonunion. Neurosurgical consultation is recommended for further evaluation. Multilevel degenerative change without acute abnormality. Critical Value/emergent results were called by telephone at the time of interpretation on 06/09/2018 at 8:26 am to Dr. Aletta Edouard , who verbally acknowledged these results. Electronically Signed   By: Inez Catalina M.D.   On: 06/09/2018 08:26   Dg Shoulder Left  Result Date: 06/09/2018 CLINICAL DATA:  Un witnessed fall with shoulder pain, initial encounter EXAM: LEFT SHOULDER - 2+ VIEW COMPARISON:  None. FINDINGS: Degenerative changes of the acromioclavicular joint are seen. No acute fracture or dislocation is seen. The underlying bony thorax appears within normal limits. IMPRESSION: No acute abnormality noted. Electronically Signed   By: Inez Catalina M.D.   On: 06/09/2018 07:49   Dg Hand Complete Left  Result Date: 06/09/2018 CLINICAL DATA:  Recent fall with left hand pain, initial encounter EXAM: LEFT HAND - COMPLETE 3+ VIEW COMPARISON:  None. FINDINGS: Significant degenerative changes  are noted in the interphalangeal joints as well as the first [facility] joint. No acute fracture or dislocation is noted. No gross soft tissue abnormality is seen. IMPRESSION: Degenerative change without acute fracture. Electronically Signed   By: Inez Catalina M.D.   On: 06/09/2018 07:50   Ct Maxillofacial Wo Cm  Result Date: 06/09/2018 CLINICAL DATA:  Recent fall with left-sided facial injury, initial encounter EXAM: CT HEAD WITHOUT CONTRAST CT MAXILLOFACIAL WITHOUT CONTRAST CT CERVICAL SPINE WITHOUT CONTRAST TECHNIQUE: Multidetector CT imaging of the head, cervical spine, and maxillofacial structures were performed using the standard protocol without intravenous contrast. Multiplanar CT image reconstructions of the cervical spine and maxillofacial structures were also generated. COMPARISON:  07/25/2013. FINDINGS: CT HEAD FINDINGS Brain: Mild atrophic changes and chronic white matter ischemic changes are seen. No findings to suggest acute hemorrhage, acute infarction or space-occupying mass lesion are noted. Vascular: No hyperdense vessel or unexpected calcification. Skull: The bony calvarium is intact. No acute fracture is noted. A chronic right zygomatic arch fracture is seen and stable from the prior exam. Other: Soft tissue swelling is noted in the left periorbital region laterally small amount of subcutaneous air is noted. CT MAXILLOFACIAL FINDINGS Osseous: Depressed zygomatic arch fracture is noted on the right with healing stable from the previous exam. Left zygomatic arch is within normal limits. No acute fracture is noted. No orbital blowout fracture is noted. Orbits: The orbits and their contents are within normal limits. Periorbital swelling is noted on the left consistent with the recent injury. A small amount of subcutaneous air is seen along the supraorbital ridge laterally. Sinuses: Paranasal sinuses are within normal limits. Soft tissues: Periorbital swelling on the left as described above. No other  soft tissue abnormality is seen. CT CERVICAL SPINE FINDINGS Alignment: Loss of the normal cervical lordosis is noted Skull base and vertebrae: 7 cervical segments are well visualized. Considerable pannus formation is noted posterior to the C1-C2 articulation stable from multiple previous exams. The base of the odontoid demonstrates fracture although both fragments are well corticated and this is felt to be chronic in nature with nonunion. These changes are likely related to pannus erosion. These changes are new from the prior exam. Multilevel disc space narrowing with osteophytic changes are seen. Multilevel facet hypertrophic changes are noted. No acute fracture or acute facet abnormality is seen. Soft tissues and spinal canal: Chronic calcifications are identified. Narrowing of the cervical cord is noted at the craniocervical junction related to the exuberant pannus seen. The possibility motion at the nonunion deserves consideration. Upper chest: Within normal limits. Other: None IMPRESSION: CT of the head: No acute intracranial abnormality noted. Atrophic and chronic white matter ischemic changes are again seen. Left periorbital soft tissue swelling related to the recent injury. CT of the maxillofacial bones: Left periorbital soft tissue swelling without acute underlying bony abnormality. Chronic right zygomatic arch fracture. CT of the cervical spine: Considerable pain as formation at C1-C2 with erosion into the base of the odontoid and subsequent fracture. This is new from 2015 but due to the well corticated fragments appears to be chronic in nature with nonunion. The pannus causes some central canal stenosis and impingement of the cervical cord. Although not evaluated on this exam it is possible there is some abnormal motion at the area of nonunion. Neurosurgical consultation is recommended for further evaluation. Multilevel degenerative change without acute abnormality. Critical Value/emergent results were  called by telephone at the time of interpretation on 06/09/2018 at 8:26 am to Dr. Aletta Edouard , who verbally acknowledged these results. Electronically Signed   By: Inez Catalina M.D.   On: 06/09/2018 08:26    Procedures .Marland KitchenLaceration Repair Date/Time: 06/09/2018 8:03 AM Performed by: Hayden Rasmussen, MD Authorized by: Hayden Rasmussen, MD   Consent:    Consent obtained:  Verbal   Consent given by:  Patient  Risks discussed:  Infection, pain, poor cosmetic result, poor wound healing and retained foreign body   Alternatives discussed:  No treatment and delayed treatment Anesthesia (see MAR for exact dosages):    Anesthesia method:  Local infiltration   Local anesthetic:  Lidocaine 2% WITH epi Laceration details:    Location:  Face   Face location:  L eyebrow   Length (cm):  3 Repair type:    Repair type:  Simple Pre-procedure details:    Preparation:  Patient was prepped and draped in usual sterile fashion Exploration:    Contaminated: no   Treatment:    Area cleansed with:  Saline Skin repair:    Repair method:  Sutures   Suture size:  5-0   Suture material:  Prolene   Suture technique:  Simple interrupted   Number of sutures:  4 Approximation:    Approximation:  Close Post-procedure details:    Dressing:  Antibiotic ointment and sterile dressing   Patient tolerance of procedure:  Tolerated well, no immediate complications   (including critical care time)  Medications Ordered in ED Medications  Tdap (BOOSTRIX) injection 0.5 mL (has no administration in time range)  lidocaine-EPINEPHrine (XYLOCAINE W/EPI) 2 %-1:200000 (PF) injection 10 mL (has no administration in time range)     Initial Impression / Assessment and Plan / ED Course  I have reviewed the triage vital signs and the nursing notes.  Pertinent labs & imaging results that were available during my care of the patient were reviewed by me and considered in my medical decision making (see chart for  details).  Clinical Course as of Jun 10 1634  Wed Jun 09, 4951  3715 85 year old female with history of dementia unwitnessed fall striking her face.  She also seems to have some pain in her left shoulder and a skin tear in her left hand.  Due to her dementia we are getting CTs of her head max face and C-spine along with x-rays of her left shoulder and left hand.  Differential diagnosis includes facial fractures, intercerebral bleed, C-spine fracture, contusions   [MB]  0826 Just received a call from radiology regarding the patient's CT C-spine which shows a lot of degenerative changes but concerns for cord impingement.   [MB]  [id number] I consulted neurosurgery regarding the patient's CAT scan findings.  After discussion with Dr. Saintclair Halsted he felt that he would not offer her any surgical procedure and she does not need an MRI.  He felt that may be a soft collar for comfort if she would tolerate it otherwise would not treat.  I will contact family and update them on the findings and make sure they are comfortable with her returning back to the facility.   [MB]  [phone] Discussed with patient's grandson Franki Cabot who is the power of attorney.  I made him aware of the arthritis in the neck and the possibility of cord impingement and also neurosurgery's recommendations of no surgical candidate.  He is in agreement and is comfortable with the patient returning home back to her facility.  He said he is had some luck with helping with her arthritis pain with Voltaren gel and asked if I would prescribe it for her back at the facility.   [MB]    Clinical Course User Index [MB] Hayden Rasmussen, MD   MADDYX VALLIE was evaluated in Emergency Department on 06/09/2018 for the symptoms described in the history of present illness. She was evaluated in the context of the  global COVID-19 pandemic, which necessitated consideration that the patient might be at risk for infection with the SARS-CoV-2 virus that causes  COVID-19. Institutional protocols and algorithms that pertain to the evaluation of patients at risk for COVID-19 are in a state of rapid change based on information released by regulatory bodies including the CDC and federal and state organizations. These policies and algorithms were followed during the patient's care in the ED.     Final Clinical Impressions(s) / ED Diagnoses   Final diagnoses:  Fall, initial encounter  Facial laceration, initial encounter  Contusion of face, initial encounter  Skin tear of left hand without complication, initial encounter  Cervical stenosis of spine    ED Discharge Orders    None       Hayden Rasmussen, MD 06/10/18 1636

## 2018-06-22 DIAGNOSIS — Z1383 Encounter for screening for respiratory disorder NEC: Secondary | ICD-10-CM | POA: Diagnosis not present

## 2018-06-22 DIAGNOSIS — Z20828 Contact with and (suspected) exposure to other viral communicable diseases: Secondary | ICD-10-CM | POA: Diagnosis not present

## 2018-07-01 DIAGNOSIS — E43 Unspecified severe protein-calorie malnutrition: Secondary | ICD-10-CM | POA: Diagnosis not present

## 2018-07-01 DIAGNOSIS — G309 Alzheimer's disease, unspecified: Secondary | ICD-10-CM | POA: Diagnosis not present

## 2018-07-01 DIAGNOSIS — E785 Hyperlipidemia, unspecified: Secondary | ICD-10-CM | POA: Diagnosis not present

## 2018-07-01 DIAGNOSIS — I10 Essential (primary) hypertension: Secondary | ICD-10-CM | POA: Diagnosis not present

## 2018-07-08 DIAGNOSIS — J189 Pneumonia, unspecified organism: Secondary | ICD-10-CM | POA: Diagnosis not present

## 2018-07-08 DIAGNOSIS — I10 Essential (primary) hypertension: Secondary | ICD-10-CM | POA: Diagnosis not present

## 2018-07-08 DIAGNOSIS — R0602 Shortness of breath: Secondary | ICD-10-CM | POA: Diagnosis not present

## 2018-07-08 DIAGNOSIS — R05 Cough: Secondary | ICD-10-CM | POA: Diagnosis not present

## 2018-07-08 DIAGNOSIS — E43 Unspecified severe protein-calorie malnutrition: Secondary | ICD-10-CM | POA: Diagnosis not present

## 2018-07-08 DIAGNOSIS — R918 Other nonspecific abnormal finding of lung field: Secondary | ICD-10-CM | POA: Diagnosis not present

## 2018-07-08 DIAGNOSIS — R400 Somnolence: Secondary | ICD-10-CM | POA: Diagnosis not present

## 2018-07-08 DIAGNOSIS — I517 Cardiomegaly: Secondary | ICD-10-CM | POA: Diagnosis not present

## 2018-07-14 DIAGNOSIS — E119 Type 2 diabetes mellitus without complications: Secondary | ICD-10-CM | POA: Diagnosis not present

## 2018-07-14 DIAGNOSIS — E7849 Other hyperlipidemia: Secondary | ICD-10-CM | POA: Diagnosis not present

## 2018-07-14 DIAGNOSIS — Z79899 Other long term (current) drug therapy: Secondary | ICD-10-CM | POA: Diagnosis not present

## 2018-07-14 DIAGNOSIS — D518 Other vitamin B12 deficiency anemias: Secondary | ICD-10-CM | POA: Diagnosis not present

## 2018-07-29 DIAGNOSIS — E7849 Other hyperlipidemia: Secondary | ICD-10-CM | POA: Diagnosis not present

## 2018-07-29 DIAGNOSIS — Z79899 Other long term (current) drug therapy: Secondary | ICD-10-CM | POA: Diagnosis not present

## 2018-07-29 DIAGNOSIS — I10 Essential (primary) hypertension: Secondary | ICD-10-CM | POA: Diagnosis not present

## 2018-07-29 DIAGNOSIS — E559 Vitamin D deficiency, unspecified: Secondary | ICD-10-CM | POA: Diagnosis not present

## 2018-07-29 DIAGNOSIS — F0281 Dementia in other diseases classified elsewhere with behavioral disturbance: Secondary | ICD-10-CM | POA: Diagnosis not present

## 2018-07-29 DIAGNOSIS — E038 Other specified hypothyroidism: Secondary | ICD-10-CM | POA: Diagnosis not present

## 2018-07-29 DIAGNOSIS — E119 Type 2 diabetes mellitus without complications: Secondary | ICD-10-CM | POA: Diagnosis not present

## 2018-07-29 DIAGNOSIS — E43 Unspecified severe protein-calorie malnutrition: Secondary | ICD-10-CM | POA: Diagnosis not present

## 2018-07-29 DIAGNOSIS — D518 Other vitamin B12 deficiency anemias: Secondary | ICD-10-CM | POA: Diagnosis not present

## 2018-07-29 DIAGNOSIS — J189 Pneumonia, unspecified organism: Secondary | ICD-10-CM | POA: Diagnosis not present

## 2018-08-14 DIAGNOSIS — W19XXXA Unspecified fall, initial encounter: Secondary | ICD-10-CM | POA: Diagnosis not present

## 2018-08-14 DIAGNOSIS — F0390 Unspecified dementia without behavioral disturbance: Secondary | ICD-10-CM | POA: Diagnosis not present

## 2018-08-14 DIAGNOSIS — S60412A Abrasion of right middle finger, initial encounter: Secondary | ICD-10-CM | POA: Diagnosis not present

## 2018-08-14 DIAGNOSIS — W1839XA Other fall on same level, initial encounter: Secondary | ICD-10-CM | POA: Diagnosis not present

## 2018-08-14 DIAGNOSIS — Z79899 Other long term (current) drug therapy: Secondary | ICD-10-CM | POA: Diagnosis not present

## 2018-08-14 DIAGNOSIS — E7800 Pure hypercholesterolemia, unspecified: Secondary | ICD-10-CM | POA: Diagnosis not present

## 2018-08-14 DIAGNOSIS — S61212A Laceration without foreign body of right middle finger without damage to nail, initial encounter: Secondary | ICD-10-CM | POA: Diagnosis not present

## 2018-08-14 DIAGNOSIS — I2510 Atherosclerotic heart disease of native coronary artery without angina pectoris: Secondary | ICD-10-CM | POA: Diagnosis not present

## 2018-08-14 DIAGNOSIS — I959 Hypotension, unspecified: Secondary | ICD-10-CM | POA: Diagnosis not present

## 2018-08-14 DIAGNOSIS — S80811A Abrasion, right lower leg, initial encounter: Secondary | ICD-10-CM | POA: Diagnosis not present

## 2018-08-14 DIAGNOSIS — S0003XA Contusion of scalp, initial encounter: Secondary | ICD-10-CM | POA: Diagnosis not present

## 2018-08-14 DIAGNOSIS — R609 Edema, unspecified: Secondary | ICD-10-CM | POA: Diagnosis not present

## 2018-08-14 DIAGNOSIS — R404 Transient alteration of awareness: Secondary | ICD-10-CM | POA: Diagnosis not present

## 2018-08-14 DIAGNOSIS — Z7902 Long term (current) use of antithrombotics/antiplatelets: Secondary | ICD-10-CM | POA: Diagnosis not present

## 2018-08-14 DIAGNOSIS — S0101XA Laceration without foreign body of scalp, initial encounter: Secondary | ICD-10-CM | POA: Diagnosis not present

## 2018-08-14 DIAGNOSIS — S80812A Abrasion, left lower leg, initial encounter: Secondary | ICD-10-CM | POA: Diagnosis not present

## 2018-08-14 DIAGNOSIS — F329 Major depressive disorder, single episode, unspecified: Secondary | ICD-10-CM | POA: Diagnosis not present

## 2018-08-19 DIAGNOSIS — K5900 Constipation, unspecified: Secondary | ICD-10-CM | POA: Diagnosis not present

## 2018-08-19 DIAGNOSIS — S61212D Laceration without foreign body of right middle finger without damage to nail, subsequent encounter: Secondary | ICD-10-CM | POA: Diagnosis not present

## 2018-08-19 DIAGNOSIS — R269 Unspecified abnormalities of gait and mobility: Secondary | ICD-10-CM | POA: Diagnosis not present

## 2018-08-19 DIAGNOSIS — S0990XD Unspecified injury of head, subsequent encounter: Secondary | ICD-10-CM | POA: Diagnosis not present

## 2018-08-20 DIAGNOSIS — M2042 Other hammer toe(s) (acquired), left foot: Secondary | ICD-10-CM | POA: Diagnosis not present

## 2018-08-20 DIAGNOSIS — B351 Tinea unguium: Secondary | ICD-10-CM | POA: Diagnosis not present

## 2018-08-20 DIAGNOSIS — L84 Corns and callosities: Secondary | ICD-10-CM | POA: Diagnosis not present

## 2018-08-20 DIAGNOSIS — M79675 Pain in left toe(s): Secondary | ICD-10-CM | POA: Diagnosis not present

## 2018-08-20 DIAGNOSIS — I739 Peripheral vascular disease, unspecified: Secondary | ICD-10-CM | POA: Diagnosis not present

## 2018-08-21 DIAGNOSIS — G309 Alzheimer's disease, unspecified: Secondary | ICD-10-CM | POA: Diagnosis not present

## 2018-08-21 DIAGNOSIS — S61212A Laceration without foreign body of right middle finger without damage to nail, initial encounter: Secondary | ICD-10-CM | POA: Diagnosis not present

## 2018-08-21 DIAGNOSIS — R296 Repeated falls: Secondary | ICD-10-CM | POA: Diagnosis not present

## 2018-08-21 DIAGNOSIS — F0280 Dementia in other diseases classified elsewhere without behavioral disturbance: Secondary | ICD-10-CM | POA: Diagnosis not present

## 2018-08-21 DIAGNOSIS — S0191XD Laceration without foreign body of unspecified part of head, subsequent encounter: Secondary | ICD-10-CM | POA: Diagnosis not present

## 2018-08-21 DIAGNOSIS — I10 Essential (primary) hypertension: Secondary | ICD-10-CM | POA: Diagnosis not present

## 2018-08-21 DIAGNOSIS — F419 Anxiety disorder, unspecified: Secondary | ICD-10-CM | POA: Diagnosis not present

## 2018-08-21 DIAGNOSIS — R2681 Unsteadiness on feet: Secondary | ICD-10-CM | POA: Diagnosis not present

## 2018-08-25 DIAGNOSIS — S0191XD Laceration without foreign body of unspecified part of head, subsequent encounter: Secondary | ICD-10-CM | POA: Diagnosis not present

## 2018-08-25 DIAGNOSIS — F419 Anxiety disorder, unspecified: Secondary | ICD-10-CM | POA: Diagnosis not present

## 2018-08-25 DIAGNOSIS — S61212A Laceration without foreign body of right middle finger without damage to nail, initial encounter: Secondary | ICD-10-CM | POA: Diagnosis not present

## 2018-08-25 DIAGNOSIS — G309 Alzheimer's disease, unspecified: Secondary | ICD-10-CM | POA: Diagnosis not present

## 2018-08-25 DIAGNOSIS — I10 Essential (primary) hypertension: Secondary | ICD-10-CM | POA: Diagnosis not present

## 2018-08-25 DIAGNOSIS — F0280 Dementia in other diseases classified elsewhere without behavioral disturbance: Secondary | ICD-10-CM | POA: Diagnosis not present

## 2018-08-26 DIAGNOSIS — L209 Atopic dermatitis, unspecified: Secondary | ICD-10-CM | POA: Diagnosis not present

## 2018-08-28 DIAGNOSIS — S61212A Laceration without foreign body of right middle finger without damage to nail, initial encounter: Secondary | ICD-10-CM | POA: Diagnosis not present

## 2018-08-28 DIAGNOSIS — S0191XD Laceration without foreign body of unspecified part of head, subsequent encounter: Secondary | ICD-10-CM | POA: Diagnosis not present

## 2018-08-28 DIAGNOSIS — F419 Anxiety disorder, unspecified: Secondary | ICD-10-CM | POA: Diagnosis not present

## 2018-08-28 DIAGNOSIS — G309 Alzheimer's disease, unspecified: Secondary | ICD-10-CM | POA: Diagnosis not present

## 2018-08-28 DIAGNOSIS — I10 Essential (primary) hypertension: Secondary | ICD-10-CM | POA: Diagnosis not present

## 2018-08-28 DIAGNOSIS — F0280 Dementia in other diseases classified elsewhere without behavioral disturbance: Secondary | ICD-10-CM | POA: Diagnosis not present

## 2018-08-30 DIAGNOSIS — F419 Anxiety disorder, unspecified: Secondary | ICD-10-CM | POA: Diagnosis not present

## 2018-08-30 DIAGNOSIS — G309 Alzheimer's disease, unspecified: Secondary | ICD-10-CM | POA: Diagnosis not present

## 2018-08-30 DIAGNOSIS — S61212A Laceration without foreign body of right middle finger without damage to nail, initial encounter: Secondary | ICD-10-CM | POA: Diagnosis not present

## 2018-08-30 DIAGNOSIS — I10 Essential (primary) hypertension: Secondary | ICD-10-CM | POA: Diagnosis not present

## 2018-08-30 DIAGNOSIS — F0280 Dementia in other diseases classified elsewhere without behavioral disturbance: Secondary | ICD-10-CM | POA: Diagnosis not present

## 2018-08-30 DIAGNOSIS — S0191XD Laceration without foreign body of unspecified part of head, subsequent encounter: Secondary | ICD-10-CM | POA: Diagnosis not present

## 2018-08-31 DIAGNOSIS — G309 Alzheimer's disease, unspecified: Secondary | ICD-10-CM | POA: Diagnosis not present

## 2018-08-31 DIAGNOSIS — I10 Essential (primary) hypertension: Secondary | ICD-10-CM | POA: Diagnosis not present

## 2018-08-31 DIAGNOSIS — S61212A Laceration without foreign body of right middle finger without damage to nail, initial encounter: Secondary | ICD-10-CM | POA: Diagnosis not present

## 2018-08-31 DIAGNOSIS — F419 Anxiety disorder, unspecified: Secondary | ICD-10-CM | POA: Diagnosis not present

## 2018-08-31 DIAGNOSIS — F0280 Dementia in other diseases classified elsewhere without behavioral disturbance: Secondary | ICD-10-CM | POA: Diagnosis not present

## 2018-08-31 DIAGNOSIS — S0191XD Laceration without foreign body of unspecified part of head, subsequent encounter: Secondary | ICD-10-CM | POA: Diagnosis not present

## 2018-09-01 DIAGNOSIS — F419 Anxiety disorder, unspecified: Secondary | ICD-10-CM | POA: Diagnosis not present

## 2018-09-01 DIAGNOSIS — G309 Alzheimer's disease, unspecified: Secondary | ICD-10-CM | POA: Diagnosis not present

## 2018-09-01 DIAGNOSIS — F0280 Dementia in other diseases classified elsewhere without behavioral disturbance: Secondary | ICD-10-CM | POA: Diagnosis not present

## 2018-09-01 DIAGNOSIS — I10 Essential (primary) hypertension: Secondary | ICD-10-CM | POA: Diagnosis not present

## 2018-09-03 DIAGNOSIS — S0191XD Laceration without foreign body of unspecified part of head, subsequent encounter: Secondary | ICD-10-CM | POA: Diagnosis not present

## 2018-09-03 DIAGNOSIS — G309 Alzheimer's disease, unspecified: Secondary | ICD-10-CM | POA: Diagnosis not present

## 2018-09-03 DIAGNOSIS — F419 Anxiety disorder, unspecified: Secondary | ICD-10-CM | POA: Diagnosis not present

## 2018-09-03 DIAGNOSIS — S61212A Laceration without foreign body of right middle finger without damage to nail, initial encounter: Secondary | ICD-10-CM | POA: Diagnosis not present

## 2018-09-03 DIAGNOSIS — F0280 Dementia in other diseases classified elsewhere without behavioral disturbance: Secondary | ICD-10-CM | POA: Diagnosis not present

## 2018-09-03 DIAGNOSIS — I10 Essential (primary) hypertension: Secondary | ICD-10-CM | POA: Diagnosis not present

## 2018-09-07 DIAGNOSIS — Z85038 Personal history of other malignant neoplasm of large intestine: Secondary | ICD-10-CM | POA: Diagnosis not present

## 2018-09-07 DIAGNOSIS — F419 Anxiety disorder, unspecified: Secondary | ICD-10-CM | POA: Diagnosis not present

## 2018-09-07 DIAGNOSIS — Z79899 Other long term (current) drug therapy: Secondary | ICD-10-CM | POA: Diagnosis not present

## 2018-09-07 DIAGNOSIS — S61212A Laceration without foreign body of right middle finger without damage to nail, initial encounter: Secondary | ICD-10-CM | POA: Diagnosis not present

## 2018-09-07 DIAGNOSIS — F0280 Dementia in other diseases classified elsewhere without behavioral disturbance: Secondary | ICD-10-CM | POA: Diagnosis not present

## 2018-09-07 DIAGNOSIS — R404 Transient alteration of awareness: Secondary | ICD-10-CM | POA: Diagnosis not present

## 2018-09-07 DIAGNOSIS — G911 Obstructive hydrocephalus: Secondary | ICD-10-CM | POA: Diagnosis not present

## 2018-09-07 DIAGNOSIS — R4020 Unspecified coma: Secondary | ICD-10-CM | POA: Diagnosis not present

## 2018-09-07 DIAGNOSIS — Z955 Presence of coronary angioplasty implant and graft: Secondary | ICD-10-CM | POA: Diagnosis not present

## 2018-09-07 DIAGNOSIS — I609 Nontraumatic subarachnoid hemorrhage, unspecified: Secondary | ICD-10-CM | POA: Diagnosis not present

## 2018-09-07 DIAGNOSIS — I610 Nontraumatic intracerebral hemorrhage in hemisphere, subcortical: Secondary | ICD-10-CM | POA: Diagnosis not present

## 2018-09-07 DIAGNOSIS — I619 Nontraumatic intracerebral hemorrhage, unspecified: Secondary | ICD-10-CM | POA: Diagnosis not present

## 2018-09-07 DIAGNOSIS — I10 Essential (primary) hypertension: Secondary | ICD-10-CM | POA: Diagnosis not present

## 2018-09-07 DIAGNOSIS — I2510 Atherosclerotic heart disease of native coronary artery without angina pectoris: Secondary | ICD-10-CM | POA: Diagnosis not present

## 2018-09-07 DIAGNOSIS — I639 Cerebral infarction, unspecified: Secondary | ICD-10-CM | POA: Diagnosis not present

## 2018-09-07 DIAGNOSIS — S0191XD Laceration without foreign body of unspecified part of head, subsequent encounter: Secondary | ICD-10-CM | POA: Diagnosis not present

## 2018-09-07 DIAGNOSIS — R58 Hemorrhage, not elsewhere classified: Secondary | ICD-10-CM | POA: Diagnosis not present

## 2018-09-07 DIAGNOSIS — F329 Major depressive disorder, single episode, unspecified: Secondary | ICD-10-CM | POA: Diagnosis not present

## 2018-09-07 DIAGNOSIS — E7800 Pure hypercholesterolemia, unspecified: Secondary | ICD-10-CM | POA: Diagnosis not present

## 2018-09-07 DIAGNOSIS — I6200 Nontraumatic subdural hemorrhage, unspecified: Secondary | ICD-10-CM | POA: Diagnosis not present

## 2018-09-07 DIAGNOSIS — G309 Alzheimer's disease, unspecified: Secondary | ICD-10-CM | POA: Diagnosis not present

## 2018-09-07 DIAGNOSIS — R0902 Hypoxemia: Secondary | ICD-10-CM | POA: Diagnosis not present

## 2018-09-08 DIAGNOSIS — I609 Nontraumatic subarachnoid hemorrhage, unspecified: Secondary | ICD-10-CM | POA: Diagnosis not present

## 2018-09-08 DIAGNOSIS — I610 Nontraumatic intracerebral hemorrhage in hemisphere, subcortical: Secondary | ICD-10-CM | POA: Diagnosis not present

## 2018-09-08 DIAGNOSIS — I639 Cerebral infarction, unspecified: Secondary | ICD-10-CM | POA: Diagnosis not present

## 2018-09-21 DEATH — deceased

## 2020-12-13 IMAGING — CT CT MAXILLOFACIAL WITHOUT CONTRAST
3 series · 14 of 47 positions shown, 16 images · non-contrast
Comparison: 07/25/2013.

CLINICAL DATA: Recent fall with left-sided facial injury, initial
encounter

EXAM:
CT HEAD WITHOUT CONTRAST
CT MAXILLOFACIAL WITHOUT CONTRAST
CT CERVICAL SPINE WITHOUT CONTRAST
TECHNIQUE: Multidetector CT imaging of the head, cervical spine, and
maxillofacial structures were performed using the standard protocol
without intravenous contrast. Multiplanar CT image reconstructions
of the cervical spine and maxillofacial structures were also
generated.

[Series 3: facialbone 2.0 st · axial · 0.35mm/px · z∈[-152,-10]mm · 8 of 83 slices shown, 10 images]
[im 6/83  brain]
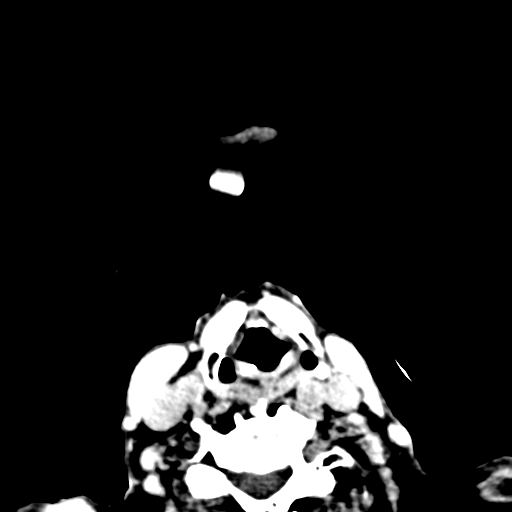
[im 6/83  bone]
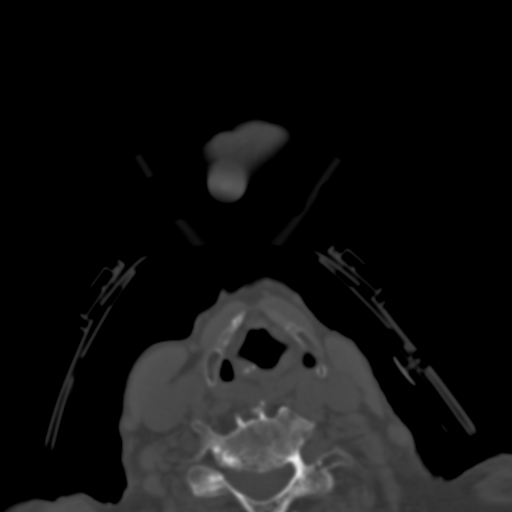
[im 17/83  bone]
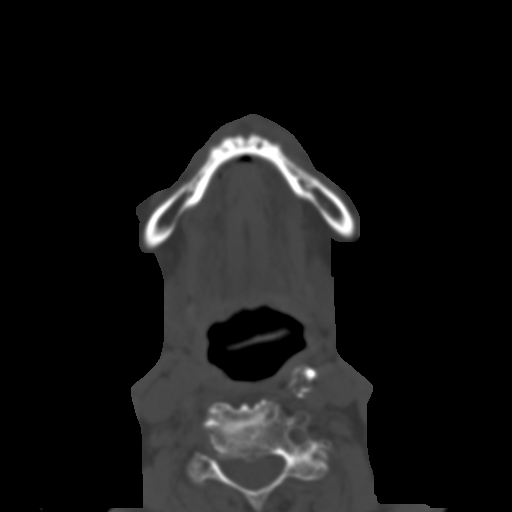
[im 26/83  bone]
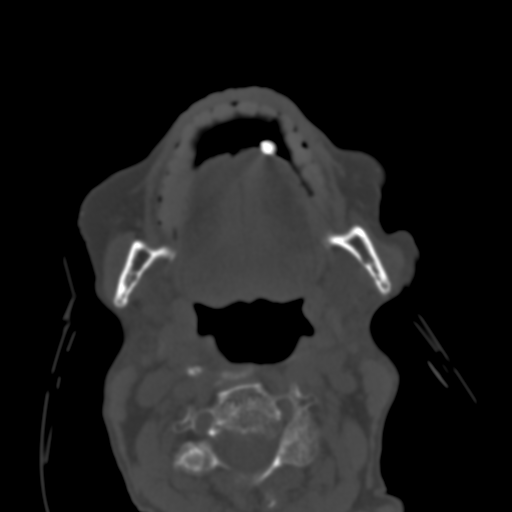
[im 37/83  bone]
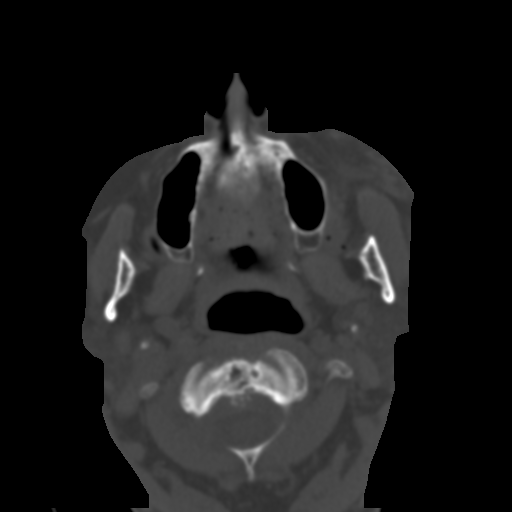
[im 46/83  brain]
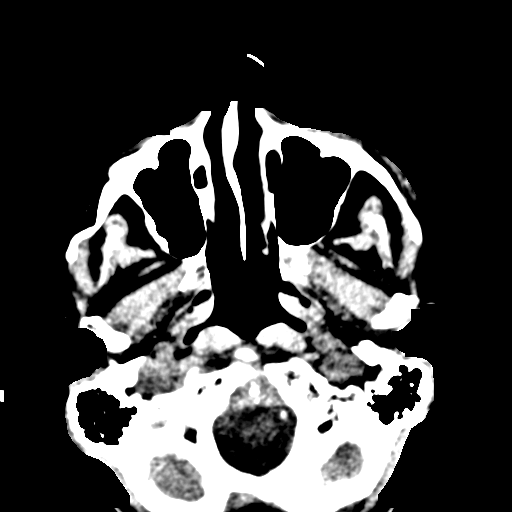
[im 46/83  bone]
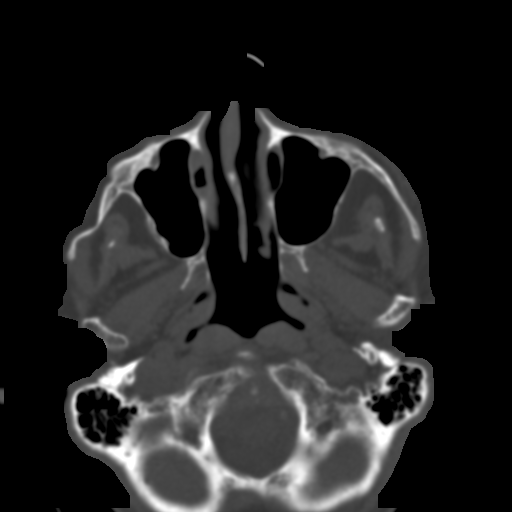
[im 57/83  bone]
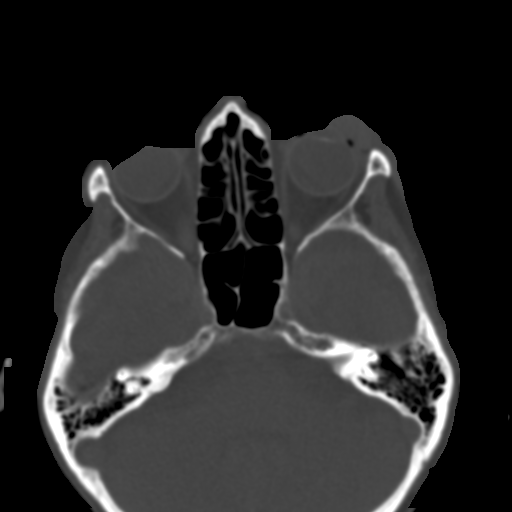
[im 66/83  bone]
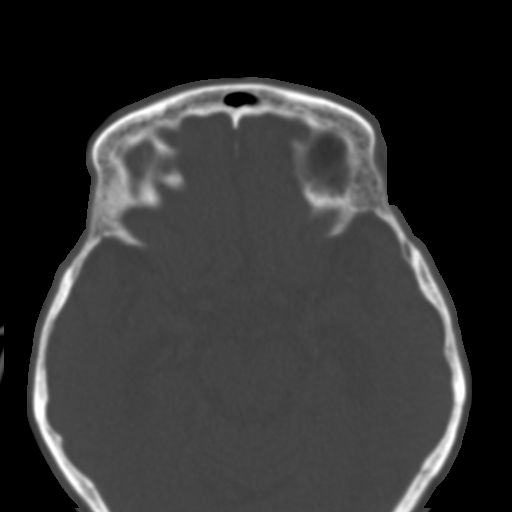
[im 77/83  bone]
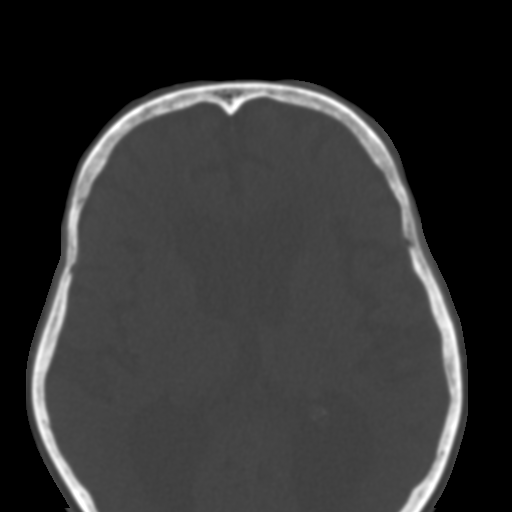

[Series 7: facialbone 2.0 cor st · coronal · 0.50mm/px · 3 of 97 slices shown]
[im 33/97  bone]
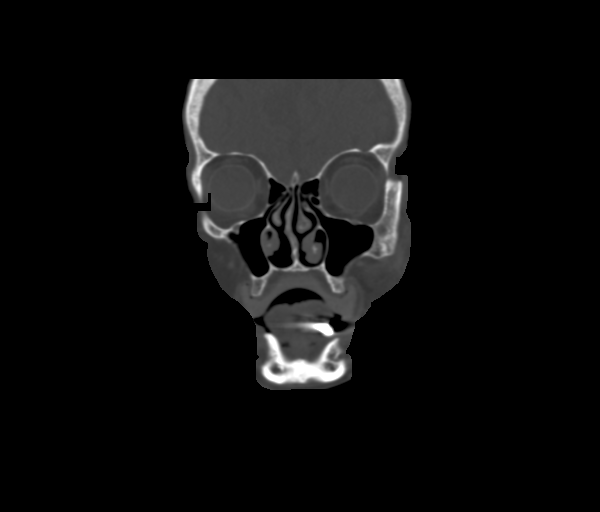
[im 43/97  bone]
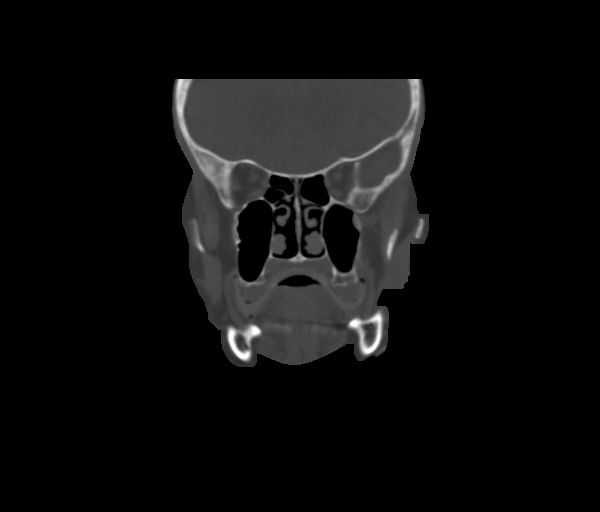
[im 54/97  bone]
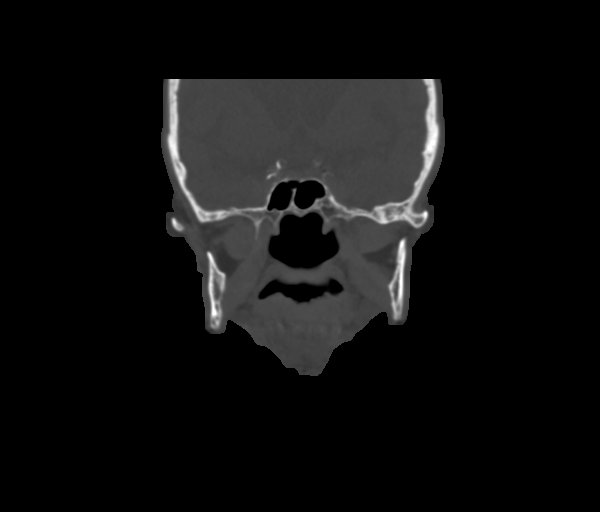

[Series 8: facialbone 2.0 sag st · sagittal · 0.36mm/px · 3 of 76 slices shown]
[im 26/76  bone]
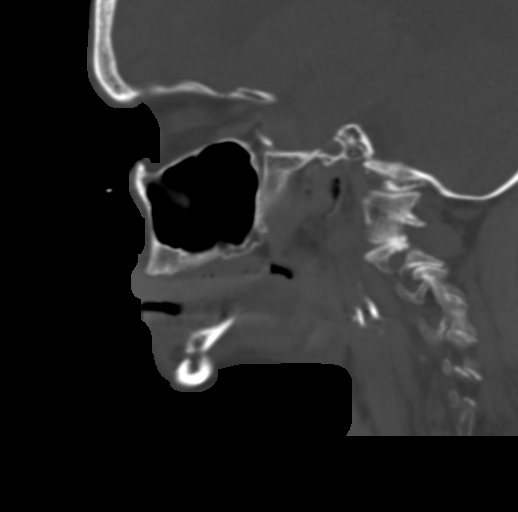
[im 38/76  bone]
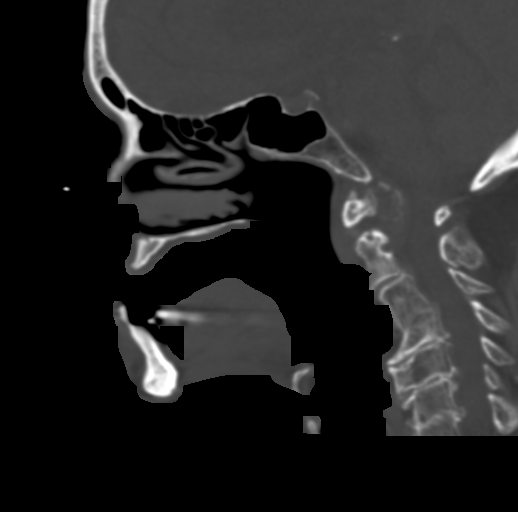
[im 51/76  bone]
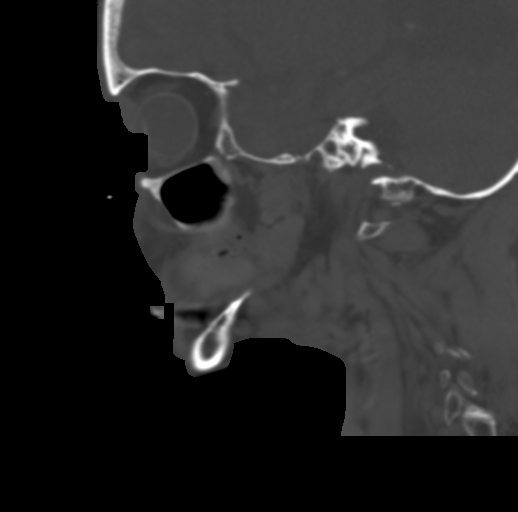

[14 of 47 positions shown; findings below may reference images not displayed]

FINDINGS: CT HEAD FINDINGS

Brain: Mild atrophic changes and chronic white matter ischemic
changes are seen. No findings to suggest acute hemorrhage, acute
infarction or space-occupying mass lesion are noted.

Vascular: No hyperdense vessel or unexpected calcification.

Skull: The bony calvarium is intact. No acute fracture is noted. A
chronic right zygomatic arch fracture is seen and stable from the
prior exam.

Other: Soft tissue swelling is noted in the left periorbital region
laterally small amount of subcutaneous air is noted.

CT MAXILLOFACIAL FINDINGS

Osseous: Depressed zygomatic arch fracture is noted on the right
with healing stable from the previous exam. Left zygomatic arch is
within normal limits. No acute fracture is noted. No orbital blowout
fracture is noted.

Orbits: The orbits and their contents are within normal limits.
Periorbital swelling is noted on the left consistent with the recent
injury. A small amount of subcutaneous air is seen along the
supraorbital ridge laterally.

Sinuses: Paranasal sinuses are within normal limits.

Soft tissues: Periorbital swelling on the left as described above.
No other soft tissue abnormality is seen.

CT CERVICAL SPINE FINDINGS

Alignment: Loss of the normal cervical lordosis is noted

Skull base and vertebrae: 7 cervical segments are well visualized.
Considerable pannus formation is noted posterior to the C1-C2
articulation stable from multiple previous exams. The base of the
odontoid demonstrates fracture although both fragments are well
corticated and this is felt to be chronic in nature with nonunion.
These changes are likely related to pannus erosion. These changes
are new from the prior exam. Multilevel disc space narrowing with
osteophytic changes are seen. Multilevel facet hypertrophic changes
are noted. No acute fracture or acute facet abnormality is seen.

Soft tissues and spinal canal: Chronic calcifications are
identified. Narrowing of the cervical cord is noted at the
craniocervical junction related to the exuberant pannus seen. The
possibility motion at the nonunion deserves consideration.

Upper chest: Within normal limits.

Other: None
IMPRESSION: CT of the head: No acute intracranial abnormality noted. Atrophic
and chronic white matter ischemic changes are again seen.

Left periorbital soft tissue swelling related to the recent injury.

CT of the maxillofacial bones: Left periorbital soft tissue swelling
without acute underlying bony abnormality.

Chronic right zygomatic arch fracture.

CT of the cervical spine: Considerable pain as formation at C1-C2
with erosion into the base of the odontoid and subsequent fracture.
This is new from 7301 but due to the well corticated fragments
appears to be chronic in nature with nonunion. The pannus causes
some central canal stenosis and impingement of the cervical cord.
Although not evaluated on this exam it is possible there is some
abnormal motion at the area of nonunion. Neurosurgical consultation
is recommended for further evaluation.

Multilevel degenerative change without acute abnormality.

Critical Value/emergent results were called by telephone at the time
of interpretation on 06/09/2018 at [DATE] to Dr. RUSEV MITRE ,
who verbally acknowledged these results.

## 2020-12-13 IMAGING — CT CT HEAD WITHOUT CONTRAST
3 of 4 series · 13 of 47 positions shown, 15 images · non-contrast
Comparison: 07/25/2013.

CLINICAL DATA: Recent fall with left-sided facial injury, initial
encounter

EXAM:
CT HEAD WITHOUT CONTRAST
CT MAXILLOFACIAL WITHOUT CONTRAST
CT CERVICAL SPINE WITHOUT CONTRAST
TECHNIQUE: Multidetector CT imaging of the head, cervical spine, and
maxillofacial structures were performed using the standard protocol
without intravenous contrast. Multiplanar CT image reconstructions
of the cervical spine and maxillofacial structures were also
generated.

[Series 3: head without · axial · non-contrast · 0.46mm/px · z∈[-84,+50]mm · 7 of 37 slices shown, 9 images]
[im 5/37  brain]
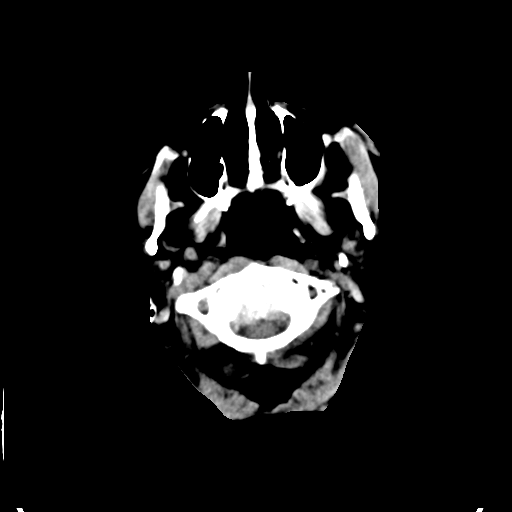
[im 5/37  bone]
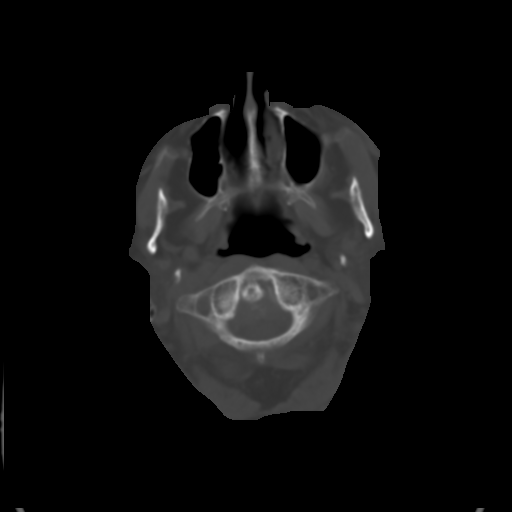
[im 10/37  brain]
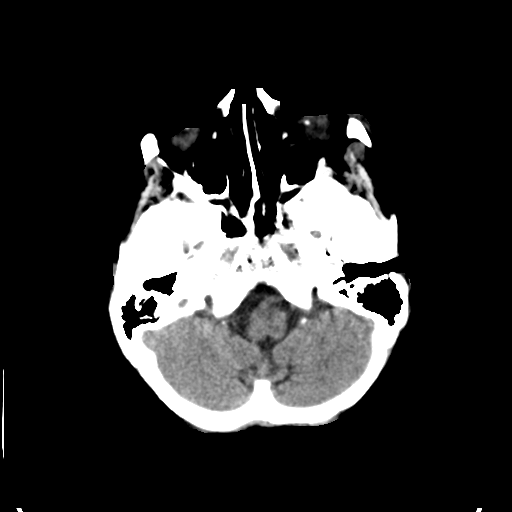
[im 14/37  brain]
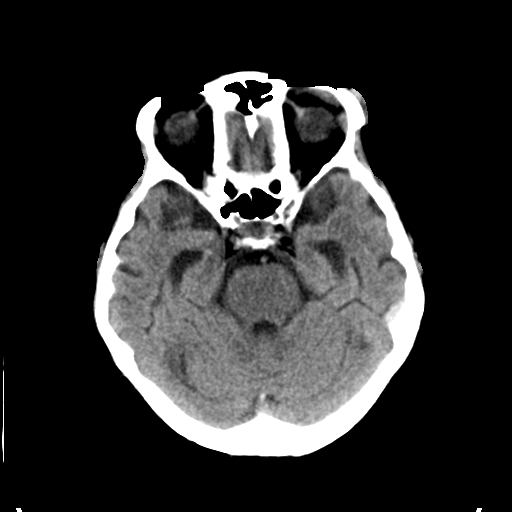
[im 19/37  brain]
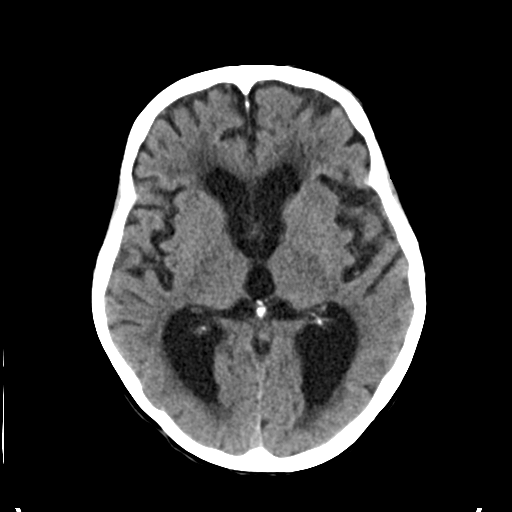
[im 23/37  brain]
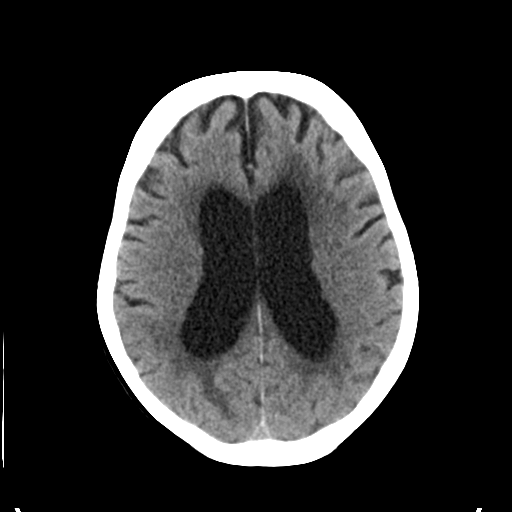
[im 23/37  bone]
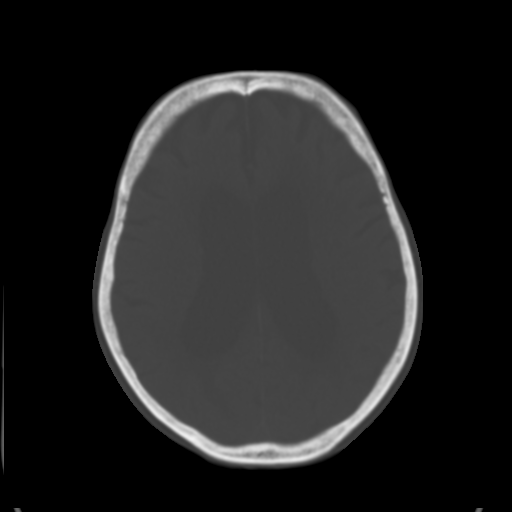
[im 28/37  brain]
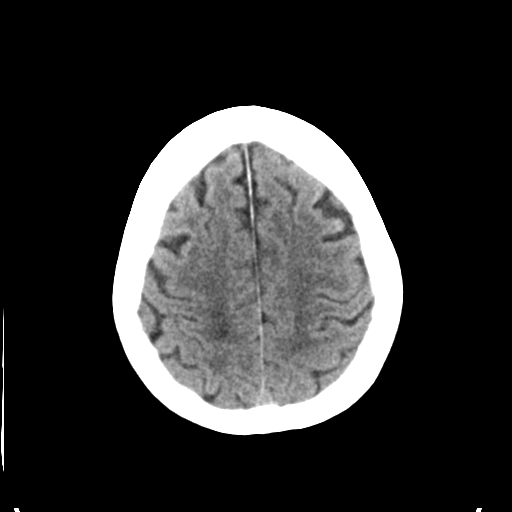
[im 32/37  brain]
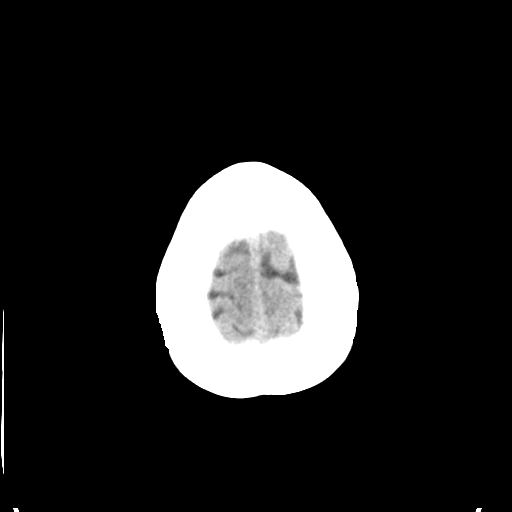

[Series 4: head bone · axial · 0.46mm/px · z∈[-86,-50]mm · 3 of 92 slices shown]
[im 10/92  bone]
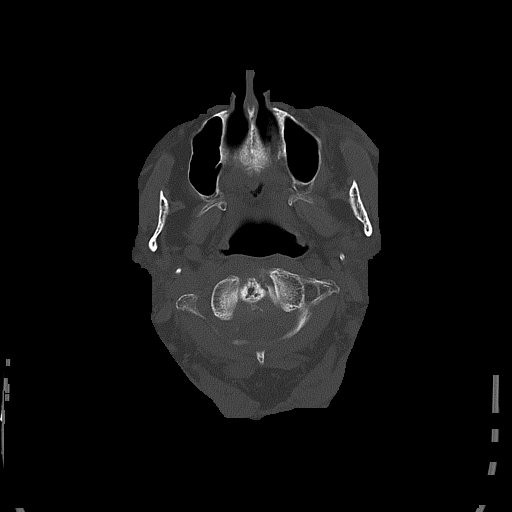
[im 19/92  bone]
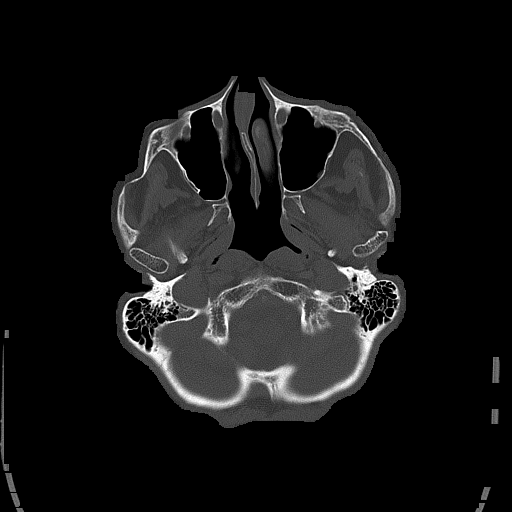
[im 28/92  bone]
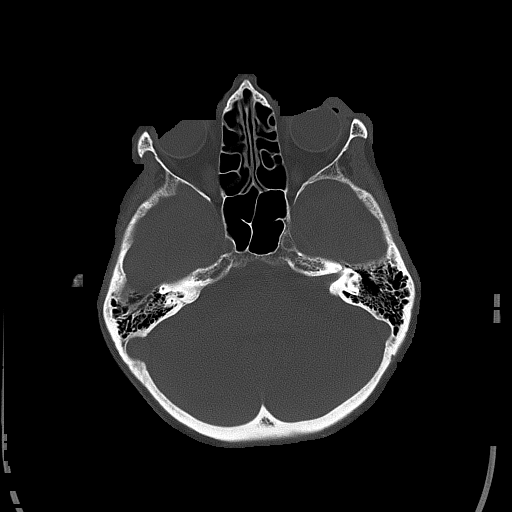

[Series 5: head without cor · coronal · non-contrast · 0.36mm/px · 3 of 82 slices shown]
[im 28/82  brain]
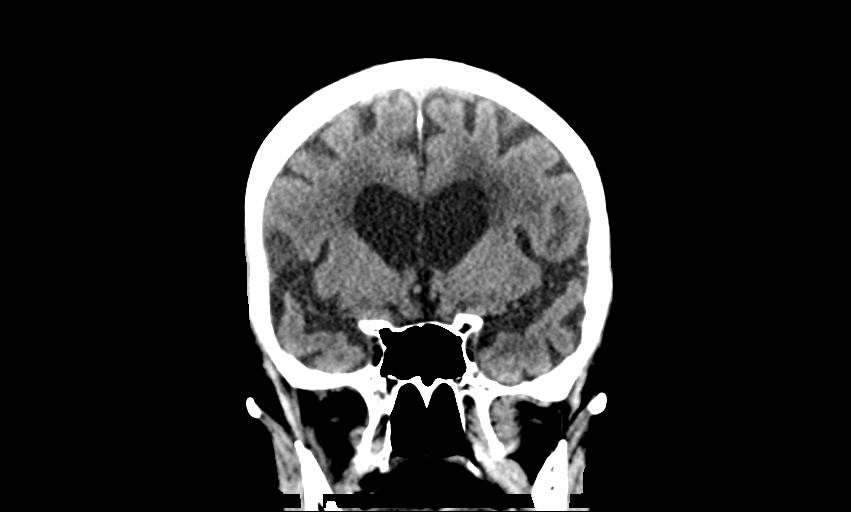
[im 37/82  brain]
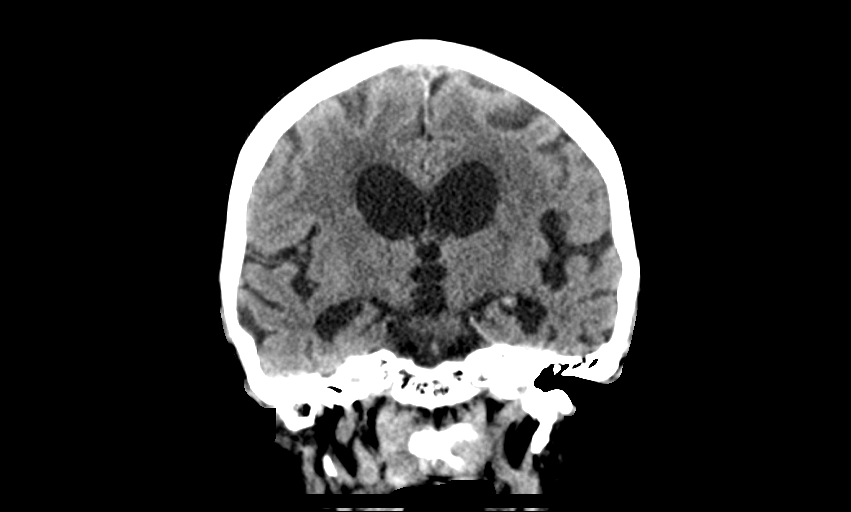
[im 46/82  brain]
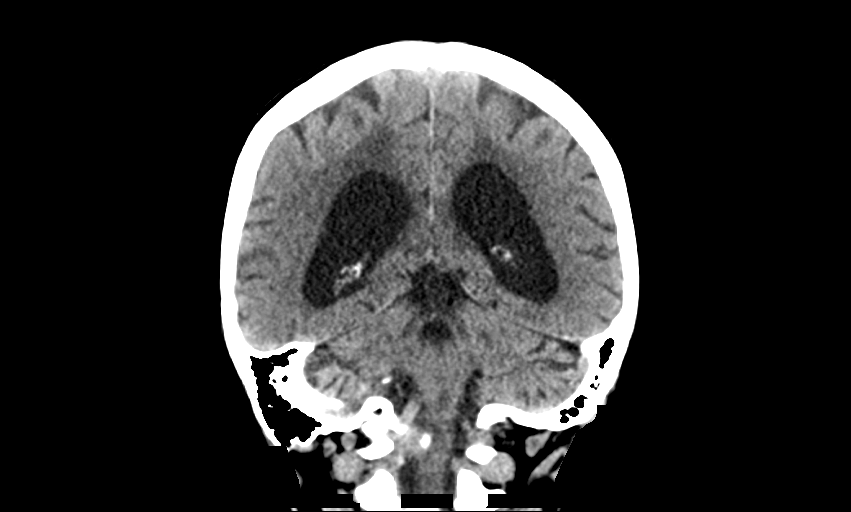

[13 of 47 positions shown; findings below may reference images not displayed]

FINDINGS: CT HEAD FINDINGS

Brain: Mild atrophic changes and chronic white matter ischemic
changes are seen. No findings to suggest acute hemorrhage, acute
infarction or space-occupying mass lesion are noted.

Vascular: No hyperdense vessel or unexpected calcification.

Skull: The bony calvarium is intact. No acute fracture is noted. A
chronic right zygomatic arch fracture is seen and stable from the
prior exam.

Other: Soft tissue swelling is noted in the left periorbital region
laterally small amount of subcutaneous air is noted.

CT MAXILLOFACIAL FINDINGS

Osseous: Depressed zygomatic arch fracture is noted on the right
with healing stable from the previous exam. Left zygomatic arch is
within normal limits. No acute fracture is noted. No orbital blowout
fracture is noted.

Orbits: The orbits and their contents are within normal limits.
Periorbital swelling is noted on the left consistent with the recent
injury. A small amount of subcutaneous air is seen along the
supraorbital ridge laterally.

Sinuses: Paranasal sinuses are within normal limits.

Soft tissues: Periorbital swelling on the left as described above.
No other soft tissue abnormality is seen.

CT CERVICAL SPINE FINDINGS

Alignment: Loss of the normal cervical lordosis is noted

Skull base and vertebrae: 7 cervical segments are well visualized.
Considerable pannus formation is noted posterior to the C1-C2
articulation stable from multiple previous exams. The base of the
odontoid demonstrates fracture although both fragments are well
corticated and this is felt to be chronic in nature with nonunion.
These changes are likely related to pannus erosion. These changes
are new from the prior exam. Multilevel disc space narrowing with
osteophytic changes are seen. Multilevel facet hypertrophic changes
are noted. No acute fracture or acute facet abnormality is seen.

Soft tissues and spinal canal: Chronic calcifications are
identified. Narrowing of the cervical cord is noted at the
craniocervical junction related to the exuberant pannus seen. The
possibility motion at the nonunion deserves consideration.

Upper chest: Within normal limits.

Other: None
IMPRESSION: CT of the head: No acute intracranial abnormality noted. Atrophic
and chronic white matter ischemic changes are again seen.

Left periorbital soft tissue swelling related to the recent injury.

CT of the maxillofacial bones: Left periorbital soft tissue swelling
without acute underlying bony abnormality.

Chronic right zygomatic arch fracture.

CT of the cervical spine: Considerable pain as formation at C1-C2
with erosion into the base of the odontoid and subsequent fracture.
This is new from 7301 but due to the well corticated fragments
appears to be chronic in nature with nonunion. The pannus causes
some central canal stenosis and impingement of the cervical cord.
Although not evaluated on this exam it is possible there is some
abnormal motion at the area of nonunion. Neurosurgical consultation
is recommended for further evaluation.

Multilevel degenerative change without acute abnormality.

Critical Value/emergent results were called by telephone at the time
of interpretation on 06/09/2018 at [DATE] to Dr. RUSEV MITRE ,
who verbally acknowledged these results.
